# Patient Record
Sex: Male | Born: 1960 | Race: White | Hispanic: No | Smoking: Never smoker
Health system: Southern US, Community
[De-identification: ages and names within clinical notes are randomized; demographics above are authoritative.]

## PROBLEM LIST (undated history)

## (undated) DIAGNOSIS — E079 Disorder of thyroid, unspecified: Secondary | ICD-10-CM

## (undated) DIAGNOSIS — T7840XA Allergy, unspecified, initial encounter: Secondary | ICD-10-CM

## (undated) DIAGNOSIS — H9190 Unspecified hearing loss, unspecified ear: Secondary | ICD-10-CM

## (undated) DIAGNOSIS — G709 Myoneural disorder, unspecified: Secondary | ICD-10-CM

## (undated) DIAGNOSIS — J45909 Unspecified asthma, uncomplicated: Secondary | ICD-10-CM

## (undated) DIAGNOSIS — F419 Anxiety disorder, unspecified: Secondary | ICD-10-CM

## (undated) DIAGNOSIS — Z8619 Personal history of other infectious and parasitic diseases: Secondary | ICD-10-CM

## (undated) DIAGNOSIS — F329 Major depressive disorder, single episode, unspecified: Secondary | ICD-10-CM

## (undated) DIAGNOSIS — F32A Depression, unspecified: Secondary | ICD-10-CM

## (undated) DIAGNOSIS — K219 Gastro-esophageal reflux disease without esophagitis: Secondary | ICD-10-CM

## (undated) HISTORY — PX: SHOULDER SURGERY: SHX246

## (undated) HISTORY — PX: FRACTURE SURGERY: SHX138

## (undated) HISTORY — DX: Depression, unspecified: F32.A

## (undated) HISTORY — DX: Personal history of other infectious and parasitic diseases: Z86.19

## (undated) HISTORY — DX: Disorder of thyroid, unspecified: E07.9

## (undated) HISTORY — DX: Major depressive disorder, single episode, unspecified: F32.9

## (undated) HISTORY — PX: KNEE SURGERY: SHX244

## (undated) HISTORY — PX: TONSILLECTOMY: SUR1361

## (undated) HISTORY — DX: Allergy, unspecified, initial encounter: T78.40XA

## (undated) HISTORY — PX: SINUS SURGERY WITH INSTATRAK: SHX5215

## (undated) HISTORY — DX: Gastro-esophageal reflux disease without esophagitis: K21.9

---

## 2001-09-23 ENCOUNTER — Ambulatory Visit (HOSPITAL_BASED_OUTPATIENT_CLINIC_OR_DEPARTMENT_OTHER): Admission: RE | Admit: 2001-09-23 | Discharge: 2001-09-23 | Payer: Self-pay | Admitting: Orthopaedic Surgery

## 2003-01-31 ENCOUNTER — Inpatient Hospital Stay (HOSPITAL_COMMUNITY): Admission: EM | Admit: 2003-01-31 | Discharge: 2003-02-04 | Payer: Self-pay

## 2003-03-25 ENCOUNTER — Ambulatory Visit (HOSPITAL_COMMUNITY): Admission: RE | Admit: 2003-03-25 | Discharge: 2003-03-25 | Payer: Self-pay | Admitting: Family Medicine

## 2008-01-30 ENCOUNTER — Encounter: Admission: RE | Admit: 2008-01-30 | Discharge: 2008-01-30 | Payer: Self-pay | Admitting: Internal Medicine

## 2009-04-24 ENCOUNTER — Emergency Department (HOSPITAL_COMMUNITY): Admission: EM | Admit: 2009-04-24 | Discharge: 2009-04-24 | Payer: Self-pay | Admitting: Emergency Medicine

## 2009-11-10 ENCOUNTER — Encounter: Admission: RE | Admit: 2009-11-10 | Discharge: 2009-11-10 | Payer: Self-pay | Admitting: Otolaryngology

## 2010-03-21 LAB — POCT I-STAT, CHEM 8
BUN: 16 mg/dL (ref 6–23)
Chloride: 106 mEq/L (ref 96–112)
Creatinine, Ser: 0.8 mg/dL (ref 0.4–1.5)
Glucose, Bld: 86 mg/dL (ref 70–99)
Potassium: 4.1 mEq/L (ref 3.5–5.1)

## 2010-03-21 LAB — DIFFERENTIAL
Eosinophils Absolute: 0.1 10*3/uL (ref 0.0–0.7)
Lymphocytes Relative: 24 % (ref 12–46)
Lymphs Abs: 1.4 10*3/uL (ref 0.7–4.0)
Monocytes Relative: 11 % (ref 3–12)
Neutro Abs: 3.7 10*3/uL (ref 1.7–7.7)
Neutrophils Relative %: 64 % (ref 43–77)

## 2010-03-21 LAB — POCT CARDIAC MARKERS
CKMB, poc: 1.9 ng/mL (ref 1.0–8.0)
Troponin i, poc: <0.05 ng/mL (ref 0.00–0.09)

## 2010-03-21 LAB — CBC
MCV: 90.1 fL (ref 78.0–100.0)
RBC: 4.32 MIL/uL (ref 4.22–5.81)
WBC: 5.8 10*3/uL (ref 4.0–10.5)

## 2010-05-19 NOTE — Discharge Summary (Signed)
NAMESEANMICHAEL, SALMONS                            ACCOUNT NO.:  [id number]   MEDICAL RECORD NO.:  [id number]                   PATIENT TYPE:  INP   LOCATION:  3013                                 FACILITY:  [facility]   PHYSICIAN:  Jeremy Quarry, MD                   DATE OF BIRTH:  Jan 28, 1960   DATE OF ADMISSION:  01/31/2003  DATE OF DISCHARGE:  02/04/2003                                 DISCHARGE SUMMARY   HISTORY:  Jeremy [name] is a healthy 50 year old man who was at [facility] sledding on January 31, 2003, when he fell off the sled sustaining  an injury to the sacral area and subsequently developed intense pain in the  back.  There was no associated numbness, bladder problem or weakness in the  lower extremity.  The patient was initially transported to [facility] where an x-ray showed a 50% L2 compression fracture.  This was also  confirmed by a CT scan.  At the time of the patient's presentation I  reviewed his CT scan with the [facility] radiologist who felt that  this was a standard compression fracture with no evidence of deformity of  posterior elements.  The patient was admitted chiefly for pain management.   PHYSICAL EXAMINATION ON ADMISSION:  GENERAL:  The patient is an alert male  who is in moderate pain.  HEENT:  Exam is within normal limits.  CHEST:  Clear to auscultation and percussion.  BACK:  Examination of the back revealed tenderness, diffusely in the lumbar  area.  CARDIOVASCULAR:  Normal S1 and S2.  No murmur, gallop or rub.  ABDOMEN:  Benign, normal bowel sounds, no masses, tenderness or  organomegaly.  NEUROLOGIC:  Knee jerks and ankle jerks were 2+.  The patient was able to  dorsiflex and plantar flex the feet normally.  He could dorsiflex the great  toe, bilaterally without difficulty.  He was able to extend the legs and  flex and extend at the knees with excellent strength.  There was no sensory  impairment. During the course of the  patient's subsequent management he was  seen by Dr. Jerl [name] for evaluation.  Dr. Jerl [name] recommended that [employer]  supply the patient with a brace and this was accomplished.   LABORATORY DATA:  Lab studies obtained included a CBC which revealed a white  count of 6,300, hemoglobin 14.2, hematocrit 42, sodium 141, potassium 3.6,  glucose 97, creatinine 0.9.  Liver profile was normal.  A MRI scan of the  lumbar spine was done.  This again showed an L2 compression fracture with  anterior wedging.  No evidence of nerve root impingement was identified.   By February 04, 2003, the patient was ready for discharge.  During the later  portion of his hospitalization his main problem was constipation which  ultimately resolved with a combination of Lactulose and Dulcolax  suppository.  DISCHARGE MEDICATIONS:  1. Motrin 800 mg t.i.d. with meals.  2. Protonix 40 mg daily.  3. Tylox one to two q.4h. p.r.n. pain.  4. Lactulose one to two tablespoons p.r.n. constipation.   DISCHARGE DIAGNOSES:  1. L2 compression fracture.  2. Constipation.   DISCHARGE INSTRUCTIONS:  The patient was instructed at the time of discharge  to followup with Dr. Jerl [name] in one to two weeks.   CONDITION ON DISCHARGE:  Good.                                                Jeremy Quarry, MD    SY/MEDQ  D:  03/16/2003  T:  03/18/2003  Job:  [id number]   cc:   Jeremy [name], M.D.  [address]  [place]  [place] [address]  Fax: [phone]   Jeremy [name]. Jeremy [name], M.D.  [address]  [place]  [place] [address]  Fax: [phone]

## 2010-05-19 NOTE — Op Note (Signed)
[name], [name]                            ACCOUNT NO.:  [id number]   MEDICAL RECORD NO.:  [id number]                   PATIENT TYPE:  AMB   LOCATION:  DSC                                  FACILITY:  [facility]   PHYSICIAN:  Lubertha Basque. Jerl Santos, M.D.             DATE OF BIRTH:  [birth year]   DATE OF PROCEDURE:  09/23/2001  DATE OF DISCHARGE:                                 OPERATIVE REPORT   PREOPERATIVE DIAGNOSES:  1. Right knee torn medial meniscus.  2. Right knee chondromalacia of the patella.  3. Left knee synovitis.   POSTOPERATIVE DIAGNOSES:  1. Right knee torn medial meniscus.  2. Right knee chondromalacia of the patella.  3. Left knee synovitis.   PROCEDURES:  1. Right knee partial medial meniscectomy.  2. Right knee chondroplasty of the patella.  3. Right knee arthroscopic lateral release.  4. Left knee injection.   ANESTHESIA:  Knee block, MAC.   SURGEON:  Lubertha Basque. Jerl Santos, M.D.   ASSISTANT:  Prince Rome, P.A.   INDICATION FOR PROCEDURE:  The patient is a 50 year old male with a long  history of right and recent history of left knee pain.  His knee pain has  persisted despite activity restriction, supervised physical therapy, oral  anti-inflammatories, and bracing.  At this point he is offered operative  intervention.  An MRI scan has shown a medial meniscus tear and  chondromalacia of the patella.  The planned procedure is for an arthroscopy  on the right with an injection on the left.  Informed operative consent was  obtained after discussion of the possible complications of, reaction to  anesthesia, and infection.   DESCRIPTION OF PROCEDURE:  The patient was taken to the operating suite,  where knee block was applied along with MAC.  He was positioned supine and  prepped and draped in the normal sterile fashion.  After the administration  of preop IV antibiotics, an arthroscopy of the right knee was performed  through a total of three portals.  The  medial compartment exhibited a  degenerative tear of the posterior horn of the medial meniscus, addressed  with a partial medial meniscectomy taking about 10% of the structure back to  a stable rim.  There were no significant degenerative changes in this  compartment.  The lateral compartment exhibited no evidence of meniscal or  articular cartilage injury, and the ACL was intact.  The patellofemoral  joint exhibited grade 3 change across a good portion of the structure with  lateral tracking of the patella.  A thorough chondroplasty was done along  with an arthroscopic lateral release through the additional portal.  This  did improve the tracking of the patella significantly.  The knee was  thoroughly irrigated at the end of the case, followed by placement of  Marcaine with epinephrine and morphine.  Adaptic was applied to the portals,  followed by dry gauze  and a loose Ace wrap.  The left knee was the prepped  and injected with Depo-Medrol and lidocaine, where a Band-Aid was applied.  Estimated blood loss and intraoperative fluids can be obtained from  anesthesia records.   DISPOSITION:  The patient was taken to the recovery room in stable  condition.  Plans were for him to go home the same day and to follow up in  the office in less than a week.  I will contact him by phone tonight.                                               Lubertha Basque Jerl Santos, M.D.    PGD/MEDQ  D:  09/23/2001  T:  09/24/2001  Job:  [id number]

## 2014-09-11 DIAGNOSIS — J4520 Mild intermittent asthma, uncomplicated: Secondary | ICD-10-CM | POA: Insufficient documentation

## 2014-09-11 DIAGNOSIS — J3089 Other allergic rhinitis: Secondary | ICD-10-CM

## 2014-09-11 DIAGNOSIS — J302 Other seasonal allergic rhinitis: Secondary | ICD-10-CM | POA: Insufficient documentation

## 2014-09-11 DIAGNOSIS — J45990 Exercise induced bronchospasm: Secondary | ICD-10-CM

## 2014-09-28 ENCOUNTER — Other Ambulatory Visit: Payer: Self-pay

## 2014-09-28 MED ORDER — LEVOCETIRIZINE DIHYDROCHLORIDE 5 MG PO TABS: 5.0000 mg | ORAL_TABLET | Freq: Every day | ORAL | Status: DC

## 2014-09-28 NOTE — Telephone Encounter (Signed)
PATIENT CALLED AND WAS INQUIRING ABOUT HIS MEDICATION LEVOCETIRIZINE  TAB 90 DAY SUPPLY PATIENT WAS SEEN IN OFFICE ON 09/22/2014. PATIENT WAS WONDERING IF THE MEDICATION WAS SENT TO [facility]. PATIENT WOULD LIKE A CALL BACK.  THANKS

## 2014-09-28 NOTE — Telephone Encounter (Signed)
LEVOCETERIZINE  SENT TO [facility]. UNABLE TO  LEAVE MESSAGE FOR PATIENT.

## 2014-10-01 ENCOUNTER — Ambulatory Visit (INDEPENDENT_AMBULATORY_CARE_PROVIDER_SITE_OTHER): Payer: Self-pay | Admitting: *Deleted

## 2014-10-01 DIAGNOSIS — J309 Allergic rhinitis, unspecified: Secondary | ICD-10-CM

## 2014-10-04 ENCOUNTER — Ambulatory Visit (INDEPENDENT_AMBULATORY_CARE_PROVIDER_SITE_OTHER): Payer: Managed Care, Other (non HMO)

## 2014-10-04 DIAGNOSIS — J301 Allergic rhinitis due to pollen: Secondary | ICD-10-CM | POA: Diagnosis not present

## 2014-10-12 ENCOUNTER — Ambulatory Visit (INDEPENDENT_AMBULATORY_CARE_PROVIDER_SITE_OTHER): Payer: Managed Care, Other (non HMO) | Admitting: *Deleted

## 2014-10-12 ENCOUNTER — Telehealth: Payer: Self-pay | Admitting: *Deleted

## 2014-10-12 DIAGNOSIS — J309 Allergic rhinitis, unspecified: Secondary | ICD-10-CM

## 2014-10-12 NOTE — Telephone Encounter (Signed)
error 

## 2014-10-18 ENCOUNTER — Ambulatory Visit (INDEPENDENT_AMBULATORY_CARE_PROVIDER_SITE_OTHER): Payer: Managed Care, Other (non HMO) | Admitting: Neurology

## 2014-10-18 DIAGNOSIS — J309 Allergic rhinitis, unspecified: Secondary | ICD-10-CM

## 2014-10-26 ENCOUNTER — Ambulatory Visit (INDEPENDENT_AMBULATORY_CARE_PROVIDER_SITE_OTHER): Payer: Managed Care, Other (non HMO)

## 2014-10-26 DIAGNOSIS — J309 Allergic rhinitis, unspecified: Secondary | ICD-10-CM

## 2014-11-01 ENCOUNTER — Ambulatory Visit (INDEPENDENT_AMBULATORY_CARE_PROVIDER_SITE_OTHER): Payer: Managed Care, Other (non HMO) | Admitting: Neurology

## 2014-11-01 DIAGNOSIS — J309 Allergic rhinitis, unspecified: Secondary | ICD-10-CM | POA: Diagnosis not present

## 2014-11-10 ENCOUNTER — Ambulatory Visit (INDEPENDENT_AMBULATORY_CARE_PROVIDER_SITE_OTHER): Payer: Managed Care, Other (non HMO) | Admitting: Neurology

## 2014-11-10 DIAGNOSIS — J309 Allergic rhinitis, unspecified: Secondary | ICD-10-CM

## 2014-11-11 DIAGNOSIS — J301 Allergic rhinitis due to pollen: Secondary | ICD-10-CM | POA: Diagnosis not present

## 2014-11-12 DIAGNOSIS — J3089 Other allergic rhinitis: Secondary | ICD-10-CM | POA: Diagnosis not present

## 2014-11-17 ENCOUNTER — Ambulatory Visit (INDEPENDENT_AMBULATORY_CARE_PROVIDER_SITE_OTHER): Payer: Managed Care, Other (non HMO)

## 2014-11-17 DIAGNOSIS — J309 Allergic rhinitis, unspecified: Secondary | ICD-10-CM

## 2014-11-24 ENCOUNTER — Ambulatory Visit (INDEPENDENT_AMBULATORY_CARE_PROVIDER_SITE_OTHER): Payer: Managed Care, Other (non HMO) | Admitting: Neurology

## 2014-11-24 DIAGNOSIS — J309 Allergic rhinitis, unspecified: Secondary | ICD-10-CM

## 2014-11-29 ENCOUNTER — Other Ambulatory Visit: Payer: Self-pay | Admitting: Neurology

## 2014-11-29 MED ORDER — AZELASTINE-FLUTICASONE 137-50 MCG/ACT NA SUSP: NASAL | Status: DC

## 2014-11-30 ENCOUNTER — Ambulatory Visit (INDEPENDENT_AMBULATORY_CARE_PROVIDER_SITE_OTHER): Payer: Managed Care, Other (non HMO)

## 2014-11-30 DIAGNOSIS — J309 Allergic rhinitis, unspecified: Secondary | ICD-10-CM

## 2014-12-02 ENCOUNTER — Encounter: Payer: Self-pay | Admitting: Family Medicine

## 2014-12-02 ENCOUNTER — Telehealth: Payer: Self-pay | Admitting: Family Medicine

## 2014-12-02 ENCOUNTER — Ambulatory Visit (INDEPENDENT_AMBULATORY_CARE_PROVIDER_SITE_OTHER): Payer: Managed Care, Other (non HMO) | Admitting: Family Medicine

## 2014-12-02 ENCOUNTER — Encounter (INDEPENDENT_AMBULATORY_CARE_PROVIDER_SITE_OTHER): Payer: Self-pay

## 2014-12-02 VITALS — BP 106/69 | HR 54 | Ht 71.0 in | Wt 188.5 lb

## 2014-12-02 DIAGNOSIS — M7712 Lateral epicondylitis, left elbow: Secondary | ICD-10-CM | POA: Diagnosis not present

## 2014-12-02 DIAGNOSIS — M869 Osteomyelitis, unspecified: Secondary | ICD-10-CM | POA: Diagnosis not present

## 2014-12-02 DIAGNOSIS — M898X8 Other specified disorders of bone, other site: Secondary | ICD-10-CM

## 2014-12-02 MED ORDER — NITROGLYCERIN 0.2 MG/HR TD PT24: MEDICATED_PATCH | TRANSDERMAL | Status: DC

## 2014-12-02 NOTE — Telephone Encounter (Signed)
Sent in - please notify patient.  Thanks!

## 2014-12-02 NOTE — Patient Instructions (Addendum)
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 Ok to cross train with cycling, swimming if this is not painful. It's probably best to avoid running though for at least 6 weeks. Ice the area 3-4 times a day for 15 minutes at a time Aleve 2 tabs twice a day with food and tylenol to help with pain. Physical therapy for hip, pelvic, core stretching/strengthening has shown to be helpful for this condition. Rehab will focus on hip adduction and abduction exercises, abdominal/low back strengthening. Steroid injection and surgery are options if you do not improve with conservative therapy though generally these are not recommended unless you don't improve over several months.  You have lateral epicondylitis Try to avoid painful activities as much as possible. Ice the area 3-4 times a day for 15 minutes at a time. Tylenol or aleve as needed for pain. Counterforce brace as directed can help unload area - wear this regularly if it provides you with relief. Hammer rotation exercise, wrist extension exercise with 1 pound weight - 3 sets of 10 once a day.   Stretching - hold for 20-30 seconds and repeat 3 times. Let me know if you want to try the nitro patches - as we discussed you can't take viagra/cialis products while on this. Consider physical therapy, injection as well if not improving. Follow up in 6 weeks.

## 2014-12-06 ENCOUNTER — Telehealth: Payer: Self-pay | Admitting: Family Medicine

## 2014-12-06 ENCOUNTER — Encounter: Payer: Self-pay | Admitting: Family Medicine

## 2014-12-06 DIAGNOSIS — M7712 Lateral epicondylitis, left elbow: Secondary | ICD-10-CM | POA: Insufficient documentation

## 2014-12-06 DIAGNOSIS — M869 Osteomyelitis, unspecified: Secondary | ICD-10-CM | POA: Insufficient documentation

## 2014-12-06 NOTE — Assessment & Plan Note (Signed)
no evidence hernia.  Consistent with osteitis pubis.  Discussed importance of avoiding painful activities with this along with physical therapy.  Icing, nsaids.  F/u in 6 weeks.

## 2014-12-06 NOTE — Telephone Encounter (Signed)
Leave it on for 24 hours then remove it and place a new patch on in a slightly different spot.  Lynette please pass on the message.  Thanks!

## 2014-12-06 NOTE — Progress Notes (Addendum)
PCP: No primary care provider on file.  Subjective:   HPI: Patient is a 54 y.o. male here for groin, left elbow pain.  Patient reports he runs about 4-6 miles daily. Ran a half marathon on 10/16 and set a personal record ([place] here). At end of race he felt a pulling sensation lower left side of abdomen/groin area. Has persisted at 3/10 level, dull, feels with running. Can become sharp. Cannot sprint due to pain. Saw urologist and told he does not have a hernia. Is taking meloxicam without much improvement. Has had one visit with Melinda Crutchave Sutton at [facility]. Also with persistent left lateral elbow pain for several months. Worse first thing in morning, gripping items. Pain 1/10 here. Right handed. No skin changes, fever, bowel/bladder dysfunction.  No past medical history on file.  Current Outpatient Prescriptions on File Prior to Visit  Medication Sig Dispense Refill  . Albuterol Sulfate (PROAIR RESPICLICK) 108 (90 BASE) MCG/ACT AEPB Inhale 2 puffs into the lungs as needed.    . Azelastine-Fluticasone (DYMISTA) 137-50 MCG/ACT SUSP Use one spray in each nostril twice daily for nasal congestion or drainage. 1 Bottle 3  . levocetirizine (XYZAL) 5 MG tablet Take 1 tablet (5 mg total) by mouth daily. 90 tablet 2  . traZODone (DESYREL) 50 MG tablet Take 50 mg by mouth at bedtime.     No current facility-administered medications on file prior to visit.    Past Surgical History  Procedure Laterality Date  . Knee surgery    . Shoulder surgery    . Tonsillectomy    . Sinus surgery with instatrak      Allergies  Allergen Reactions  . Adhesive [Tape]     Social History   Social History  . Marital Status: Single    Spouse Name: N/A  . Number of Children: N/A  . Years of Education: N/A   Occupational History  . Not on file.   Social History Main Topics  . Smoking status: Never Smoker   . Smokeless tobacco: Not on file  . Alcohol Use: Not on  file  . Drug Use: Not on file  . Sexual Activity: Not on file   Other Topics Concern  . Not on file   Social History Narrative    No family history on file.  BP 106/69 mmHg  Pulse 54  Ht 5\' 11"  (1.803 m)  Wt 188 lb 8 oz (85.503 kg)  BMI 26.30 kg/m2  Review of Systems: See HPI above.    Objective:  Physical Exam:  Gen: NAD  Back/Left hip: No gross deformity, scoliosis. TTP pubic symphysis left side.  No other tenderness. FROM. Strength LEs 5/5 all muscle groups.  Pain with adduction left hip only.  Negative SLRs. Sensation intact to light touch bilaterally. Negative logroll Negative fabers and piriformis stretches.    GU: No hernia palpable or visible with valsalva.  Left elbow: No gross deformity, swelling, bruising. TTP lateral epicondyle. FROM with mild pain on wrist extension only. Collateral ligaments intact. NVI distally.  MSK u/s:  No evidence adductor tear.  Small amount of calcification to left side of pubic symphysis.  No other abnormalities.  Assessment & Plan:  1. Left hip pain - no evidence hernia.  Consistent with osteitis pubis.  Discussed importance of avoiding painful activities with this along with physical therapy.  Icing, nsaids.  F/u in 6 weeks.    2. Left lateral epicondylitis - shown home exercises to do daily.  Nsaids, icing.  Elbow sleeve provided.  Consider physical therapy, injection.  He will trial nitro patches but we stressed it's critical he not take products for erectile dysfunction while he is on this due to dramatic drop in blood pressure that would occur - he verbalized understanding.  F/u in 6 weeks.

## 2014-12-06 NOTE — Assessment & Plan Note (Signed)
shown home exercises to do daily.  Nsaids, icing.  Elbow sleeve provided.  Consider physical therapy, injection.  He will trial nitro patches but we stressed it's critical he not take products for erectile dysfunction while he is on this due to dramatic drop in blood pressure that would occur - he verbalized understanding.  F/u in 6 weeks.

## 2014-12-07 ENCOUNTER — Ambulatory Visit (INDEPENDENT_AMBULATORY_CARE_PROVIDER_SITE_OTHER): Payer: Managed Care, Other (non HMO)

## 2014-12-07 DIAGNOSIS — J309 Allergic rhinitis, unspecified: Secondary | ICD-10-CM | POA: Diagnosis not present

## 2014-12-09 ENCOUNTER — Ambulatory Visit (INDEPENDENT_AMBULATORY_CARE_PROVIDER_SITE_OTHER): Payer: Managed Care, Other (non HMO)

## 2014-12-09 DIAGNOSIS — J309 Allergic rhinitis, unspecified: Secondary | ICD-10-CM

## 2014-12-13 ENCOUNTER — Ambulatory Visit (INDEPENDENT_AMBULATORY_CARE_PROVIDER_SITE_OTHER): Payer: Managed Care, Other (non HMO) | Admitting: Neurology

## 2014-12-13 DIAGNOSIS — J309 Allergic rhinitis, unspecified: Secondary | ICD-10-CM | POA: Diagnosis not present

## 2014-12-17 ENCOUNTER — Ambulatory Visit (INDEPENDENT_AMBULATORY_CARE_PROVIDER_SITE_OTHER): Payer: Managed Care, Other (non HMO) | Admitting: *Deleted

## 2014-12-17 DIAGNOSIS — J309 Allergic rhinitis, unspecified: Secondary | ICD-10-CM

## 2014-12-20 ENCOUNTER — Ambulatory Visit (INDEPENDENT_AMBULATORY_CARE_PROVIDER_SITE_OTHER): Payer: Managed Care, Other (non HMO)

## 2014-12-20 DIAGNOSIS — J309 Allergic rhinitis, unspecified: Secondary | ICD-10-CM | POA: Diagnosis not present

## 2014-12-23 NOTE — Progress Notes (Signed)
This encounter was created in error - please disregard.

## 2014-12-28 ENCOUNTER — Ambulatory Visit (INDEPENDENT_AMBULATORY_CARE_PROVIDER_SITE_OTHER): Payer: Managed Care, Other (non HMO)

## 2014-12-28 DIAGNOSIS — J309 Allergic rhinitis, unspecified: Secondary | ICD-10-CM

## 2014-12-31 ENCOUNTER — Telehealth: Payer: Self-pay | Admitting: Allergy and Immunology

## 2014-12-31 NOTE — Telephone Encounter (Signed)
Spoke with patient who said that his PCP just had called him in something to his pharmacy.

## 2014-12-31 NOTE — Telephone Encounter (Signed)
Pt called and said that his daughter was diagnosis with the flu. Now he thanks he is coming down with it and would like for you to call in Tamflu for [phone] [facility] [address]

## 2015-01-06 DIAGNOSIS — J301 Allergic rhinitis due to pollen: Secondary | ICD-10-CM | POA: Diagnosis not present

## 2015-01-07 DIAGNOSIS — J3089 Other allergic rhinitis: Secondary | ICD-10-CM | POA: Diagnosis not present

## 2015-01-11 ENCOUNTER — Ambulatory Visit (INDEPENDENT_AMBULATORY_CARE_PROVIDER_SITE_OTHER): Payer: Managed Care, Other (non HMO)

## 2015-01-11 DIAGNOSIS — J309 Allergic rhinitis, unspecified: Secondary | ICD-10-CM

## 2015-01-12 ENCOUNTER — Ambulatory Visit (INDEPENDENT_AMBULATORY_CARE_PROVIDER_SITE_OTHER): Payer: Managed Care, Other (non HMO) | Admitting: Family Medicine

## 2015-01-12 ENCOUNTER — Encounter: Payer: Self-pay | Admitting: Family Medicine

## 2015-01-12 ENCOUNTER — Encounter (INDEPENDENT_AMBULATORY_CARE_PROVIDER_SITE_OTHER): Payer: Self-pay

## 2015-01-12 VITALS — BP 127/80 | HR 44 | Ht 71.0 in | Wt 179.0 lb

## 2015-01-12 DIAGNOSIS — M7712 Lateral epicondylitis, left elbow: Secondary | ICD-10-CM | POA: Diagnosis not present

## 2015-01-12 DIAGNOSIS — M869 Osteomyelitis, unspecified: Secondary | ICD-10-CM

## 2015-01-12 NOTE — Patient Instructions (Signed)
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 Walk: jog program every other day if not painful. Start with 1 minute jog: 1 minute walk for 10 minutes. Next jog would be 2 minute jog: 1 minute walk for 15 minutes. Third - 3 minute jog: 1 minute walk for 20 minutes etc. Ice the area after exercise. Aleve, tylenol only if needed. Home strengthening exercises as we discussed - core, adduction strengthening. Follow up with me in 6 weeks.

## 2015-01-13 ENCOUNTER — Ambulatory Visit: Payer: Managed Care, Other (non HMO) | Admitting: Family Medicine

## 2015-01-13 NOTE — Progress Notes (Signed)
PCP: No primary care provider on file.  Subjective:   HPI: Patient is a 55 y.o. male here for groin, left elbow pain.  12/02/14: Patient reports he runs about 4-6 miles daily. Ran a half marathon on 10/16 and set a personal record ([place] here). At end of race he felt a pulling sensation lower left side of abdomen/groin area. Has persisted at 3/10 level, dull, feels with running. Can become sharp. Cannot sprint due to pain. Saw urologist and told he does not have a hernia. Is taking meloxicam without much improvement. Has had one visit with Melinda Crutchave Sutton at [facility]. Also with persistent left lateral elbow pain for several months. Worse first thing in morning, gripping items. Pain 1/10 here. Right handed. No skin changes, fever, bowel/bladder dysfunction.  01/12/15: Patient reports he feels completely better though has not been running. Pain level 0/10. Still 'feels it' in the area if he starts to jog. Doing home exercises - did a couple visits of PT. Taking meloxicam as needed. No swelling, bruising. No skin changes, fever, other complaints.  No past medical history on file.  Current Outpatient Prescriptions on File Prior to Visit  Medication Sig Dispense Refill  . Albuterol Sulfate (PROAIR RESPICLICK) 108 (90 BASE) MCG/ACT AEPB Inhale 2 puffs into the lungs as needed.    . Azelastine-Fluticasone (DYMISTA) 137-50 MCG/ACT SUSP Use one spray in each nostril twice daily for nasal congestion or drainage. 1 Bottle 3  . levocetirizine (XYZAL) 5 MG tablet Take 1 tablet (5 mg total) by mouth daily. 90 tablet 2  . meloxicam (MOBIC) 7.5 MG tablet TAKE 1 TO 2 TABLETS BY MOUTH ONCE DAILY AS NEEDED FOR PAIN  1  . nitroGLYCERIN (NITRODUR - DOSED IN MG/24 HR) 0.2 mg/hr patch Apply 1/4th patch to affected elbow, change daily 30 patch 1  . traZODone (DESYREL) 50 MG tablet Take 50 mg by mouth at bedtime.     No current facility-administered medications on file  prior to visit.    Past Surgical History  Procedure Laterality Date  . Knee surgery    . Shoulder surgery    . Tonsillectomy    . Sinus surgery with instatrak      Allergies  Allergen Reactions  . Adhesive [Tape]     Social History   Social History  . Marital Status: Single    Spouse Name: N/A  . Number of Children: N/A  . Years of Education: N/A   Occupational History  . Not on file.   Social History Main Topics  . Smoking status: Never Smoker   . Smokeless tobacco: Not on file  . Alcohol Use: Not on file  . Drug Use: Not on file  . Sexual Activity: Not on file   Other Topics Concern  . Not on file   Social History Narrative    No family history on file.  BP 127/80 mmHg  Pulse 44  Ht 5\' 11"  (1.803 m)  Wt 179 lb (81.194 kg)  BMI 24.98 kg/m2  Review of Systems: See HPI above.    Objective:  Physical Exam:  Gen: NAD, comfortable in exam room.  Back/Left hip: No gross deformity, scoliosis. No longer with TTP pubic symphysis left side.  No other tenderness. FROM. Strength LEs 5/5 all muscle groups.  No pain with adduction on left any longer  Negative SLRs. Sensation intact to light touch bilaterally. Negative logroll Negative fabers and piriformis stretches.  Assessment & Plan:  1. Left hip pain - no  evidence hernia.  Consistent with osteitis pubis.  Clinically improved with PT, home exercises, and rest.  Icing, nsaids as needed.  Continue home exercises and physical therapy.  He will attempt walk: jog program very slowly every other day - instructed to stop if pain worsens however and rest additional 6 weeks.  F/u at that time.  2. Left lateral epicondylitis - Improving.  NSAIDs, icing, nitro patches, elbow sleeve, home exercises.

## 2015-01-13 NOTE — Assessment & Plan Note (Signed)
Improving.  NSAIDs, icing, nitro patches, elbow sleeve, home exercises.

## 2015-01-13 NOTE — Assessment & Plan Note (Signed)
no evidence hernia.  Consistent with osteitis pubis.  Clinically improved with PT, home exercises, and rest.  Icing, nsaids as needed.  Continue home exercises and physical therapy.  He will attempt walk: jog program very slowly every other day - instructed to stop if pain worsens however and rest additional 6 weeks.  F/u at that time.

## 2015-01-18 ENCOUNTER — Ambulatory Visit (INDEPENDENT_AMBULATORY_CARE_PROVIDER_SITE_OTHER): Payer: Managed Care, Other (non HMO)

## 2015-01-18 DIAGNOSIS — J309 Allergic rhinitis, unspecified: Secondary | ICD-10-CM

## 2015-01-26 ENCOUNTER — Ambulatory Visit (INDEPENDENT_AMBULATORY_CARE_PROVIDER_SITE_OTHER): Payer: Managed Care, Other (non HMO)

## 2015-01-26 DIAGNOSIS — J309 Allergic rhinitis, unspecified: Secondary | ICD-10-CM | POA: Diagnosis not present

## 2015-02-01 ENCOUNTER — Ambulatory Visit (INDEPENDENT_AMBULATORY_CARE_PROVIDER_SITE_OTHER): Payer: Managed Care, Other (non HMO)

## 2015-02-01 DIAGNOSIS — J309 Allergic rhinitis, unspecified: Secondary | ICD-10-CM | POA: Diagnosis not present

## 2015-02-11 ENCOUNTER — Ambulatory Visit (INDEPENDENT_AMBULATORY_CARE_PROVIDER_SITE_OTHER): Payer: Managed Care, Other (non HMO) | Admitting: Neurology

## 2015-02-11 DIAGNOSIS — J309 Allergic rhinitis, unspecified: Secondary | ICD-10-CM

## 2015-02-23 ENCOUNTER — Ambulatory Visit (INDEPENDENT_AMBULATORY_CARE_PROVIDER_SITE_OTHER): Payer: Managed Care, Other (non HMO)

## 2015-02-23 DIAGNOSIS — J309 Allergic rhinitis, unspecified: Secondary | ICD-10-CM

## 2015-03-01 ENCOUNTER — Ambulatory Visit (INDEPENDENT_AMBULATORY_CARE_PROVIDER_SITE_OTHER): Payer: Managed Care, Other (non HMO)

## 2015-03-01 DIAGNOSIS — J309 Allergic rhinitis, unspecified: Secondary | ICD-10-CM

## 2015-03-08 ENCOUNTER — Ambulatory Visit (INDEPENDENT_AMBULATORY_CARE_PROVIDER_SITE_OTHER): Payer: Managed Care, Other (non HMO)

## 2015-03-08 DIAGNOSIS — J309 Allergic rhinitis, unspecified: Secondary | ICD-10-CM

## 2015-03-17 ENCOUNTER — Ambulatory Visit (INDEPENDENT_AMBULATORY_CARE_PROVIDER_SITE_OTHER): Payer: Managed Care, Other (non HMO)

## 2015-03-17 DIAGNOSIS — J309 Allergic rhinitis, unspecified: Secondary | ICD-10-CM

## 2015-03-19 ENCOUNTER — Other Ambulatory Visit: Payer: Self-pay | Admitting: Family Medicine

## 2015-04-05 ENCOUNTER — Ambulatory Visit (INDEPENDENT_AMBULATORY_CARE_PROVIDER_SITE_OTHER): Payer: Managed Care, Other (non HMO) | Admitting: *Deleted

## 2015-04-05 DIAGNOSIS — J309 Allergic rhinitis, unspecified: Secondary | ICD-10-CM

## 2015-04-07 ENCOUNTER — Telehealth: Payer: Self-pay | Admitting: Allergy and Immunology

## 2015-04-07 NOTE — Telephone Encounter (Signed)
He currently has [employer] insurance but is starting a new job in May. His [employer] will cover through the month of April and then the new insurance will pick up in May. He has 2 questions regarding the price of his vials and shots with [employer] vs. [employer]. He is trying to decide if he would be better off just paying out of his pocket for these and if it would be less expensive to pay out of pocket vs. Paying to keep the [employer] insurance and paying the monthly premium on that.

## 2015-04-08 NOTE — Telephone Encounter (Signed)
Gave pt prices & what [employer] allows so he can make his decision

## 2015-04-12 DIAGNOSIS — J301 Allergic rhinitis due to pollen: Secondary | ICD-10-CM | POA: Diagnosis not present

## 2015-04-13 DIAGNOSIS — J3089 Other allergic rhinitis: Secondary | ICD-10-CM | POA: Diagnosis not present

## 2015-04-26 ENCOUNTER — Ambulatory Visit (INDEPENDENT_AMBULATORY_CARE_PROVIDER_SITE_OTHER): Payer: Managed Care, Other (non HMO) | Admitting: *Deleted

## 2015-04-26 DIAGNOSIS — J309 Allergic rhinitis, unspecified: Secondary | ICD-10-CM | POA: Diagnosis not present

## 2015-05-12 ENCOUNTER — Ambulatory Visit (INDEPENDENT_AMBULATORY_CARE_PROVIDER_SITE_OTHER): Payer: Managed Care, Other (non HMO)

## 2015-05-12 DIAGNOSIS — J309 Allergic rhinitis, unspecified: Secondary | ICD-10-CM | POA: Diagnosis not present

## 2015-05-26 ENCOUNTER — Ambulatory Visit (INDEPENDENT_AMBULATORY_CARE_PROVIDER_SITE_OTHER): Payer: Managed Care, Other (non HMO)

## 2015-05-26 DIAGNOSIS — J309 Allergic rhinitis, unspecified: Secondary | ICD-10-CM | POA: Diagnosis not present

## 2015-06-08 ENCOUNTER — Other Ambulatory Visit: Payer: Self-pay | Admitting: *Deleted

## 2015-06-08 NOTE — Telephone Encounter (Signed)
Received fax refill from [facility] for Levocetirizine 5mg  denied refill last office visit 06/30/14 needs ov. Faxed to [phone]

## 2015-06-09 ENCOUNTER — Other Ambulatory Visit: Payer: Self-pay

## 2015-06-09 ENCOUNTER — Telehealth: Payer: Self-pay | Admitting: Allergy and Immunology

## 2015-06-09 ENCOUNTER — Ambulatory Visit (INDEPENDENT_AMBULATORY_CARE_PROVIDER_SITE_OTHER): Payer: PRIVATE HEALTH INSURANCE

## 2015-06-09 DIAGNOSIS — J309 Allergic rhinitis, unspecified: Secondary | ICD-10-CM

## 2015-06-09 MED ORDER — LEVOCETIRIZINE DIHYDROCHLORIDE 5 MG PO TABS: 5.0000 mg | ORAL_TABLET | Freq: Every day | ORAL | Status: DC

## 2015-06-09 NOTE — Telephone Encounter (Signed)
Sent in one refill 

## 2015-06-09 NOTE — Telephone Encounter (Addendum)
Pt came in to get shot and wanted to know why we had not called his levocetirizine 5mg . He made appointment for  July 17 at 4.oopm.

## 2015-06-24 ENCOUNTER — Ambulatory Visit (INDEPENDENT_AMBULATORY_CARE_PROVIDER_SITE_OTHER): Payer: PRIVATE HEALTH INSURANCE

## 2015-06-24 DIAGNOSIS — J309 Allergic rhinitis, unspecified: Secondary | ICD-10-CM | POA: Diagnosis not present

## 2015-06-28 ENCOUNTER — Ambulatory Visit: Payer: PRIVATE HEALTH INSURANCE | Admitting: Allergy and Immunology

## 2015-07-07 ENCOUNTER — Ambulatory Visit (INDEPENDENT_AMBULATORY_CARE_PROVIDER_SITE_OTHER): Payer: PRIVATE HEALTH INSURANCE

## 2015-07-07 DIAGNOSIS — J309 Allergic rhinitis, unspecified: Secondary | ICD-10-CM | POA: Diagnosis not present

## 2015-07-18 ENCOUNTER — Encounter: Payer: Self-pay | Admitting: Allergy and Immunology

## 2015-07-18 ENCOUNTER — Ambulatory Visit (INDEPENDENT_AMBULATORY_CARE_PROVIDER_SITE_OTHER): Payer: PRIVATE HEALTH INSURANCE | Admitting: Allergy and Immunology

## 2015-07-18 VITALS — BP 130/80 | HR 66 | Temp 97.5°F | Resp 16

## 2015-07-18 DIAGNOSIS — J45990 Exercise induced bronchospasm: Secondary | ICD-10-CM

## 2015-07-18 DIAGNOSIS — J3089 Other allergic rhinitis: Secondary | ICD-10-CM | POA: Diagnosis not present

## 2015-07-18 MED ORDER — AZELASTINE-FLUTICASONE 137-50 MCG/ACT NA SUSP: NASAL | Status: DC

## 2015-07-18 MED ORDER — ALBUTEROL SULFATE 108 (90 BASE) MCG/ACT IN AEPB: 2.0000 | INHALATION_SPRAY | RESPIRATORY_TRACT | Status: DC | PRN

## 2015-07-18 NOTE — Assessment & Plan Note (Signed)
   Continue ProAir Respiclick every 4-6 hours as needed and 15 minutes prior to exercise.

## 2015-07-18 NOTE — Progress Notes (Signed)
Follow-up Note  RE: Jeremy [name] Schepers MRN: [id number] DOB: 11/02/1960 Date of Office Visit: 07/18/2015  Primary care provider: Allean FoundSMITH,CANDACE THIELE, MD Referring provider: No ref. provider found  History of present illness: HPI Comments: Jeremy [name] Kritikos is a 55 y.o. male with allergic rhinitis on immunotherapy and exercise-induced bronchospasm presenting today for follow up.  He reports that his allergic rhinitis is well-controlled with the aeroallergen immunotherapy and as needed levocetirizine and/or Dymista.  He is tolerating immunotherapy without complications.  He uses albuterol prior to vigorous exercise but has not required albuterol for rescue purposes over the past year.  He has no complaints today.   Assessment and plan: Other allergic rhinitis  Continue appropriate allergen avoidance measures, aeroallergen immunotherapy, levocetirizine as needed, and/or Dymista as needed.  Exercise-induced bronchospasm  Continue ProAir Respiclick every 4-6 hours as needed and 15 minutes prior to exercise.    Meds ordered this encounter  Medications  . Albuterol Sulfate (PROAIR RESPICLICK) 108 (90 Base) MCG/ACT AEPB    Sig: Inhale 2 puffs into the lungs as needed. For cough or wheeze.  May use 2 puffs 10-20 minutes prior to exercise.    Dispense:  3 each    Refill:  0  . Azelastine-Fluticasone (DYMISTA) 137-50 MCG/ACT SUSP    Sig: Use 1-2 sprays in each nostril 1-2 times daily for stuffy nose or drainage.    Dispense:  3 Bottle    Refill:  1     Physical examination: Blood pressure 130/80, pulse 66, temperature 97.5 F (36.4 C), temperature source Oral, resp. rate 16.  General: Alert, interactive, in no acute distress. HEENT: TMs pearly gray, turbinates mildly edematous without discharge, post-pharynx unremarkable. Neck: Supple without lymphadenopathy. Lungs: Clear to auscultation without wheezing, rhonchi or rales. CV: Normal S1, S2 without murmurs. Skin: Warm and dry, without  lesions or rashes.  The following portions of the patient's history were reviewed and updated as appropriate: allergies, current medications, past family history, past medical history, past social history, past surgical history and problem list.    Medication List       This list is accurate as of: 07/18/15  6:05 PM.  Always use your most recent med list.               Albuterol Sulfate 108 (90 Base) MCG/ACT Aepb  Commonly known as:  PROAIR RESPICLICK  Inhale 2 puffs into the lungs as needed. For cough or wheeze.  May use 2 puffs 10-20 minutes prior to exercise.     Azelastine-Fluticasone 137-50 MCG/ACT Susp  Commonly known as:  DYMISTA  Use 1-2 sprays in each nostril 1-2 times daily for stuffy nose or drainage.     EPIPEN 2-PAK 0.3 mg/0.3 mL Soaj injection  Generic drug:  EPINEPHrine  USE AS DIRECTED FOR SEVERE ALLERGIC REACTION     levocetirizine 5 MG tablet  Commonly known as:  XYZAL  Take 1 tablet (5 mg total) by mouth daily.     meloxicam 7.5 MG tablet  Commonly known as:  MOBIC  Reported on 07/18/2015     nitroGLYCERIN 0.2 mg/hr patch  Commonly known as:  NITRODUR - Dosed in mg/24 hr  APPLY 1/4TH PATCH TO AFFECTED ELBOW, CHANGE DAILY     traZODone 50 MG tablet  Commonly known as:  DESYREL  Take 50 mg by mouth at bedtime.        Allergies  Allergen Reactions  . Adhesive [Tape]     I appreciate the opportunity to take part in  Reford's care. Please do not hesitate to contact me with questions.  Sincerely,   R. Jorene Guest, MD

## 2015-07-18 NOTE — Patient Instructions (Signed)
Other allergic rhinitis  Continue appropriate allergen avoidance measures, aeroallergen immunotherapy, levocetirizine as needed, and/or Dymista as needed.  Exercise-induced bronchospasm  Continue ProAir Respiclick every 4-6 hours as needed and 15 minutes prior to exercise.   Return in about 1 year (around 07/17/2016), or if symptoms worsen or fail to improve.

## 2015-07-18 NOTE — Assessment & Plan Note (Signed)
   Continue appropriate allergen avoidance measures, aeroallergen immunotherapy, levocetirizine as needed, and/or Dymista as needed.

## 2015-08-02 ENCOUNTER — Other Ambulatory Visit (HOSPITAL_COMMUNITY): Admission: RE | Admit: 2015-08-02 | Payer: PRIVATE HEALTH INSURANCE | Source: Ambulatory Visit

## 2015-08-04 ENCOUNTER — Ambulatory Visit (INDEPENDENT_AMBULATORY_CARE_PROVIDER_SITE_OTHER): Payer: PRIVATE HEALTH INSURANCE

## 2015-08-04 DIAGNOSIS — J309 Allergic rhinitis, unspecified: Secondary | ICD-10-CM

## 2015-08-12 ENCOUNTER — Telehealth: Payer: Self-pay | Admitting: *Deleted

## 2015-08-12 ENCOUNTER — Ambulatory Visit (INDEPENDENT_AMBULATORY_CARE_PROVIDER_SITE_OTHER): Payer: PRIVATE HEALTH INSURANCE | Admitting: *Deleted

## 2015-08-12 DIAGNOSIS — J309 Allergic rhinitis, unspecified: Secondary | ICD-10-CM | POA: Diagnosis not present

## 2015-08-12 NOTE — Telephone Encounter (Addendum)
PATIENT WOULD LIKE TO KNOW IF HE CAN BUILD UP EVERY 2 WEEKS SINCE HE HAS TO PAY A COPAY EACH TIME. PATIENT IS ON RED 1:100 SCH C  EVERY 2 WEEKS. PLEASE ADVISE

## 2015-08-15 NOTE — Telephone Encounter (Signed)
Yes, that is fine. 

## 2015-08-15 NOTE — Telephone Encounter (Signed)
I have documented this change on the pts chart

## 2015-08-15 NOTE — Telephone Encounter (Signed)
Spoke with Jeremy [name] and informed him of what dr Rod Canbobbit has stated about going every 2 weeks on injections

## 2015-08-15 NOTE — Telephone Encounter (Signed)
Lm for pt to call us about the change to injections

## 2015-08-25 ENCOUNTER — Ambulatory Visit (INDEPENDENT_AMBULATORY_CARE_PROVIDER_SITE_OTHER): Payer: PRIVATE HEALTH INSURANCE

## 2015-08-25 DIAGNOSIS — J309 Allergic rhinitis, unspecified: Secondary | ICD-10-CM | POA: Diagnosis not present

## 2015-08-31 ENCOUNTER — Encounter: Payer: Self-pay | Admitting: *Deleted

## 2015-08-31 NOTE — Progress Notes (Signed)
This encounter was created in error - please disregard.

## 2015-09-08 ENCOUNTER — Ambulatory Visit (INDEPENDENT_AMBULATORY_CARE_PROVIDER_SITE_OTHER): Payer: PRIVATE HEALTH INSURANCE

## 2015-09-08 DIAGNOSIS — J309 Allergic rhinitis, unspecified: Secondary | ICD-10-CM

## 2015-09-20 ENCOUNTER — Ambulatory Visit (INDEPENDENT_AMBULATORY_CARE_PROVIDER_SITE_OTHER): Payer: PRIVATE HEALTH INSURANCE

## 2015-09-20 DIAGNOSIS — J309 Allergic rhinitis, unspecified: Secondary | ICD-10-CM

## 2015-10-05 ENCOUNTER — Ambulatory Visit (INDEPENDENT_AMBULATORY_CARE_PROVIDER_SITE_OTHER): Payer: PRIVATE HEALTH INSURANCE

## 2015-10-05 DIAGNOSIS — J309 Allergic rhinitis, unspecified: Secondary | ICD-10-CM

## 2015-10-17 ENCOUNTER — Ambulatory Visit (INDEPENDENT_AMBULATORY_CARE_PROVIDER_SITE_OTHER): Payer: PRIVATE HEALTH INSURANCE | Admitting: *Deleted

## 2015-10-17 DIAGNOSIS — J309 Allergic rhinitis, unspecified: Secondary | ICD-10-CM

## 2015-10-25 DIAGNOSIS — J3089 Other allergic rhinitis: Secondary | ICD-10-CM | POA: Diagnosis not present

## 2015-10-26 DIAGNOSIS — J301 Allergic rhinitis due to pollen: Secondary | ICD-10-CM | POA: Diagnosis not present

## 2015-11-01 ENCOUNTER — Ambulatory Visit (INDEPENDENT_AMBULATORY_CARE_PROVIDER_SITE_OTHER): Payer: PRIVATE HEALTH INSURANCE

## 2015-11-01 DIAGNOSIS — J309 Allergic rhinitis, unspecified: Secondary | ICD-10-CM

## 2015-11-16 ENCOUNTER — Ambulatory Visit (INDEPENDENT_AMBULATORY_CARE_PROVIDER_SITE_OTHER): Payer: PRIVATE HEALTH INSURANCE | Admitting: *Deleted

## 2015-11-16 DIAGNOSIS — J309 Allergic rhinitis, unspecified: Secondary | ICD-10-CM | POA: Diagnosis not present

## 2015-12-01 ENCOUNTER — Ambulatory Visit (INDEPENDENT_AMBULATORY_CARE_PROVIDER_SITE_OTHER): Payer: PRIVATE HEALTH INSURANCE | Admitting: *Deleted

## 2015-12-01 DIAGNOSIS — J309 Allergic rhinitis, unspecified: Secondary | ICD-10-CM

## 2015-12-13 ENCOUNTER — Ambulatory Visit (INDEPENDENT_AMBULATORY_CARE_PROVIDER_SITE_OTHER): Payer: PRIVATE HEALTH INSURANCE | Admitting: *Deleted

## 2015-12-13 DIAGNOSIS — J309 Allergic rhinitis, unspecified: Secondary | ICD-10-CM

## 2016-01-03 ENCOUNTER — Ambulatory Visit (INDEPENDENT_AMBULATORY_CARE_PROVIDER_SITE_OTHER): Payer: PRIVATE HEALTH INSURANCE | Admitting: *Deleted

## 2016-01-03 DIAGNOSIS — J309 Allergic rhinitis, unspecified: Secondary | ICD-10-CM

## 2016-01-13 ENCOUNTER — Ambulatory Visit (INDEPENDENT_AMBULATORY_CARE_PROVIDER_SITE_OTHER): Payer: PRIVATE HEALTH INSURANCE

## 2016-01-13 DIAGNOSIS — J309 Allergic rhinitis, unspecified: Secondary | ICD-10-CM | POA: Diagnosis not present

## 2016-01-24 ENCOUNTER — Ambulatory Visit (INDEPENDENT_AMBULATORY_CARE_PROVIDER_SITE_OTHER): Payer: PRIVATE HEALTH INSURANCE | Admitting: *Deleted

## 2016-01-24 DIAGNOSIS — J309 Allergic rhinitis, unspecified: Secondary | ICD-10-CM | POA: Diagnosis not present

## 2016-01-25 NOTE — Addendum Note (Signed)
Addended by: Maryjean MornFREEMAN, Eliu Batch D on: 01/25/2016 02:47 PM   Modules accepted: Orders

## 2016-02-07 ENCOUNTER — Ambulatory Visit (INDEPENDENT_AMBULATORY_CARE_PROVIDER_SITE_OTHER): Payer: PRIVATE HEALTH INSURANCE

## 2016-02-07 DIAGNOSIS — J309 Allergic rhinitis, unspecified: Secondary | ICD-10-CM | POA: Diagnosis not present

## 2016-03-07 ENCOUNTER — Ambulatory Visit (INDEPENDENT_AMBULATORY_CARE_PROVIDER_SITE_OTHER): Payer: PRIVATE HEALTH INSURANCE | Admitting: *Deleted

## 2016-03-07 DIAGNOSIS — J309 Allergic rhinitis, unspecified: Secondary | ICD-10-CM | POA: Diagnosis not present

## 2016-04-12 ENCOUNTER — Ambulatory Visit (INDEPENDENT_AMBULATORY_CARE_PROVIDER_SITE_OTHER): Payer: PRIVATE HEALTH INSURANCE | Admitting: *Deleted

## 2016-04-12 DIAGNOSIS — J309 Allergic rhinitis, unspecified: Secondary | ICD-10-CM

## 2016-05-07 ENCOUNTER — Ambulatory Visit (INDEPENDENT_AMBULATORY_CARE_PROVIDER_SITE_OTHER): Payer: PRIVATE HEALTH INSURANCE

## 2016-05-07 DIAGNOSIS — J309 Allergic rhinitis, unspecified: Secondary | ICD-10-CM

## 2016-05-09 ENCOUNTER — Encounter: Payer: Self-pay | Admitting: *Deleted

## 2016-05-10 ENCOUNTER — Telehealth: Payer: Self-pay

## 2016-05-10 MED ORDER — FLUTICASONE PROPIONATE 50 MCG/ACT NA SUSP: 2.0000 | Freq: Every day | NASAL | 0 refills | Status: DC

## 2016-05-10 MED ORDER — AZELASTINE HCL 0.15 % NA SOLN: 2.0000 | Freq: Two times a day (BID) | NASAL | 0 refills | Status: DC

## 2016-05-10 NOTE — Telephone Encounter (Signed)
Called patient. We received a fax from [facility] for Flonase. I wanted to confirm with patient if that was corrected,because he was originally on Dymista. Patietnt stated the Dymista is not covered. I am sending in Flonase and Azelastine for 90 days.  Patient has an appointment for 07/09/2016 with Dr. Nunzio CobbsBobbitt.

## 2016-05-11 DIAGNOSIS — J3089 Other allergic rhinitis: Secondary | ICD-10-CM | POA: Diagnosis not present

## 2016-05-16 ENCOUNTER — Encounter: Payer: Self-pay | Admitting: *Deleted

## 2016-05-21 ENCOUNTER — Encounter: Payer: Self-pay | Admitting: Allergy and Immunology

## 2016-05-21 ENCOUNTER — Ambulatory Visit (INDEPENDENT_AMBULATORY_CARE_PROVIDER_SITE_OTHER): Payer: PRIVATE HEALTH INSURANCE | Admitting: Allergy and Immunology

## 2016-05-21 VITALS — BP 100/62 | HR 69 | Temp 98.1°F | Resp 16 | Ht 70.0 in | Wt 182.4 lb

## 2016-05-21 DIAGNOSIS — J45901 Unspecified asthma with (acute) exacerbation: Secondary | ICD-10-CM

## 2016-05-21 DIAGNOSIS — J3089 Other allergic rhinitis: Secondary | ICD-10-CM

## 2016-05-21 DIAGNOSIS — J0100 Acute maxillary sinusitis, unspecified: Secondary | ICD-10-CM | POA: Diagnosis not present

## 2016-05-21 DIAGNOSIS — J45990 Exercise induced bronchospasm: Secondary | ICD-10-CM

## 2016-05-21 DIAGNOSIS — J019 Acute sinusitis, unspecified: Secondary | ICD-10-CM | POA: Insufficient documentation

## 2016-05-21 MED ORDER — ALBUTEROL SULFATE 108 (90 BASE) MCG/ACT IN AEPB: 2.0000 | INHALATION_SPRAY | RESPIRATORY_TRACT | 0 refills | Status: DC | PRN

## 2016-05-21 MED ORDER — BUDESONIDE 0.5 MG/2ML IN SUSP: 0.5000 mg | Freq: Two times a day (BID) | RESPIRATORY_TRACT | 1 refills | Status: DC | PRN

## 2016-05-21 MED ORDER — PREDNISONE 1 MG PO TABS: 10.0000 mg | ORAL_TABLET | Freq: Every day | ORAL | Status: DC

## 2016-05-21 NOTE — Assessment & Plan Note (Signed)
   Prednisone has been provided, 40 mg x3 days, 20 mg x3 day, 10 mg x3 day, then stop.  For now and during respiratory tract infections or asthma flares, add Asmanex 200g 2 inhalations 2 times per day until symptoms have returned to baseline.   To maximize pulmonary deposition, a spacer has been provided along with instructions for its proper administration with an HFA inhaler.  Continue albuterol HFA, 1-2 inhalations every 4-6 hours as needed.  The patient has been asked to contact me if his symptoms persist or progress. Otherwise, he may return for follow up in 4 months.

## 2016-05-21 NOTE — Assessment & Plan Note (Addendum)
   Prednisone has been provided (as above).  Start budesonide/saline nasal irrigation twice a day.  A prescription has been provided for budesonide 0.5 mg respules and instructions for mixing and adminstering the rinse have been discussed and provided in written form.  (Hold fluticasone nasal spray while using budesonide/saline nasal irrigation).  Continue azelastine nasal spray, 1-2 sprays per nostril twice daily as needed.  For thick post nasal drainage, add guaifenesin 1200 mg (Mucinex Maximum Strength)  twice daily as needed with adequate hydration as discussed.  (Hold levocetirizine while taking Mucinex, as levocetirizine may increase mucus viscosity).   The patient has been asked to contact me if his symptoms persist, progress, or if he becomes febrile.

## 2016-05-21 NOTE — Assessment & Plan Note (Signed)
   Continue appropriate allergen avoidance measures and resume aeroallergen immunotherapy injections after the asthma exacerbation has resolved.

## 2016-05-21 NOTE — Progress Notes (Signed)
Follow-up Note  RE: Jeremy [name] [place] MRN: [id number] DOB: 01/30/1960 Date of Office Visit: 05/21/2016  Primary care provider: Merri BrunetteSmith, Candace, MD Referring provider: Merri BrunetteSmith, Candace, MD  History of present illness: Jeremy [name] is a 56 y.o. male with allergic rhinitis in the next as needed bronchospasm presented today for follow up.  He was last seen in this clinic in July 2017.  He reports that over the past 3 weeks he has been experiencing coughing, dyspnea, chest tightness, and decreased exercise capacity. He typically does aerobic exercise for an hour per day, however recently he has had decreased exercise opacity.  He denies referred pain to the jaw and upper extremity.  Over the same timeframe he has been experiencing frequent sneezing, nasal congestion, thick postnasal drainage, sinus pressure, and ear pressure.  He believes he may have experienced low-grade fever on one or 2 occasions over the past couple weeks, but is uncertain certain.  He denies chills and discolored mucus production.   Assessment and plan: Asthma with acute exacerbation  Prednisone has been provided, 40 mg x3 days, 20 mg x3 day, 10 mg x3 day, then stop.  For now and during respiratory tract infections or asthma flares, add Asmanex 200g 2 inhalations 2 times per day until symptoms have returned to baseline.   To maximize pulmonary deposition, a spacer has been provided along with instructions for its proper administration with an HFA inhaler.  Continue albuterol HFA, 1-2 inhalations every 4-6 hours as needed.  The patient has been asked to contact me if his symptoms persist or progress. Otherwise, he may return for follow up in 4 months.  Acute sinusitis  Prednisone has been provided (as above).  Start budesonide/saline nasal irrigation twice a day.  A prescription has been provided for budesonide 0.5 mg respules and instructions for mixing and adminstering the rinse have been discussed and provided in written  form.  (Hold fluticasone nasal spray while using budesonide/saline nasal irrigation).  Continue azelastine nasal spray, 1-2 sprays per nostril twice daily as needed.  For thick post nasal drainage, add guaifenesin 1200 mg (Mucinex Maximum Strength)  twice daily as needed with adequate hydration as discussed.  (Hold levocetirizine while taking Mucinex, as levocetirizine may increase mucus viscosity).   The patient has been asked to contact me if his symptoms persist, progress, or if he becomes febrile.   Other allergic rhinitis  Continue appropriate allergen avoidance measures and resume aeroallergen immunotherapy injections after the asthma exacerbation has resolved.   Meds ordered this encounter  Medications  . Albuterol Sulfate (PROAIR RESPICLICK) 108 (90 Base) MCG/ACT AEPB    Sig: Inhale 2 puffs into the lungs as needed. For cough or wheeze.  May use 2 puffs 10-20 minutes prior to exercise.    Dispense:  3 each    Refill:  0  . budesonide (PULMICORT) 0.5 MG/2ML nebulizer solution    Sig: Take 2 mLs (0.5 mg total) by nebulization 2 (two) times daily as needed.    Dispense:  120 mL    Refill:  1  . predniSONE (DELTASONE) tablet 10 mg    Diagnostics: Spirometry reveals an FVC of 5.14 L and an FEV1 of 3.45 L, FEV1 ratio of 84% with 190 mL post bronchodilator improvement.   Please see scanned spirometry results for details.    Physical examination: Blood pressure 100/62, pulse 69, temperature 98.1 F (36.7 C), temperature source Oral, resp. rate 16, height 5\' 10"  (1.778 m), weight 182 lb 6.4 oz (82.7 kg), SpO2  95 %.  General: Alert, interactive, in no acute distress. HEENT: TMs pearly gray, turbinates edematous with thick discharge, post-pharynx markedly erythematous. Neck: Supple without lymphadenopathy. Lungs: Mildly decreased breath sounds bilaterally without wheezing, rhonchi or rales. CV: Normal S1, S2 without murmurs. Skin: Warm and dry, without lesions or rashes.  The  following portions of the patient's history were reviewed and updated as appropriate: allergies, current medications, past family history, past medical history, past social history, past surgical history and problem list.  Allergies as of 05/21/2016      Reactions   Adhesive [tape]       Medication List       Accurate as of 05/21/16  6:06 PM. Always use your most recent med list.          Albuterol Sulfate 108 (90 Base) MCG/ACT Aepb Commonly known as:  PROAIR RESPICLICK Inhale 2 puffs into the lungs as needed. For cough or wheeze.  May use 2 puffs 10-20 minutes prior to exercise.   Azelastine HCl 0.15 % Soln Place 2 sprays into both nostrils 2 (two) times daily.   Azelastine-Fluticasone 137-50 MCG/ACT Susp Commonly known as:  DYMISTA Use 1-2 sprays in each nostril 1-2 times daily for stuffy nose or drainage.   budesonide 0.5 MG/2ML nebulizer solution Commonly known as:  PULMICORT Take 2 mLs (0.5 mg total) by nebulization 2 (two) times daily as needed.   EPIPEN 2-PAK 0.3 mg/0.3 mL Soaj injection Generic drug:  EPINEPHrine USE AS DIRECTED FOR SEVERE ALLERGIC REACTION   fluticasone 50 MCG/ACT nasal spray Commonly known as:  FLONASE Place 2 sprays into both nostrils daily.   levocetirizine 5 MG tablet Commonly known as:  XYZAL Take 1 tablet (5 mg total) by mouth daily.   traZODone 50 MG tablet Commonly known as:  DESYREL Take 50 mg by mouth at bedtime.       Allergies  Allergen Reactions  . Adhesive [Tape]    Review of systems: Review of systems negative except as noted in HPI / PMHx or noted below: Constitutional: Negative.  HENT: Negative.   Eyes: Negative.  Respiratory: Negative.   Cardiovascular: Negative.  Gastrointestinal: Negative.  Genitourinary: Negative.  Musculoskeletal: Negative.  Neurological: Negative.  Endo/Heme/Allergies: Negative.  Cutaneous: Negative.  History reviewed. No pertinent past medical history.  History reviewed. No pertinent  family history.  Social History   Social History  . Marital status: Single    Spouse name: N/A  . Number of children: N/A  . Years of education: N/A   Occupational History  . Not on file.   Social History Main Topics  . Smoking status: Never Smoker  . Smokeless tobacco: Never Used  . Alcohol use Not on file  . Drug use: Unknown  . Sexual activity: Not on file   Other Topics Concern  . Not on file   Social History Narrative  . No narrative on file    I appreciate the opportunity to take part in Keshon's care. Please do not hesitate to contact me with questions.  Sincerely,   R. Jorene Guest, MD

## 2016-05-21 NOTE — Patient Instructions (Addendum)
Asthma with acute exacerbation  Prednisone has been provided, 40 mg x3 days, 20 mg x3 day, 10 mg x3 day, then stop.  For now and during respiratory tract infections or asthma flares, add Asmanex 200g 2 inhalations 2 times per day until symptoms have returned to baseline.   To maximize pulmonary deposition, a spacer has been provided along with instructions for its proper administration with an HFA inhaler.  Continue albuterol HFA, 1-2 inhalations every 4-6 hours as needed.  The patient has been asked to contact me if his symptoms persist or progress. Otherwise, he may return for follow up in 4 months.  Acute sinusitis  Prednisone has been provided (as above).  Start budesonide/saline nasal irrigation twice a day.  A prescription has been provided for budesonide 0.5 mg respules and instructions for mixing and adminstering the rinse have been discussed and provided in written form.  (Hold fluticasone nasal spray while using budesonide/saline nasal irrigation).  Continue azelastine nasal spray, 1-2 sprays per nostril twice daily as needed.  For thick post nasal drainage, add guaifenesin 1200 mg (Mucinex Maximum Strength)  twice daily as needed with adequate hydration as discussed.  (Hold levocetirizine while taking Mucinex, as levocetirizine may increase mucus viscosity).   The patient has been asked to contact me if his symptoms persist, progress, or if he becomes febrile.   Other allergic rhinitis  Continue appropriate allergen avoidance measures and resume aeroallergen immunotherapy injections after the asthma exacerbation has resolved.   Return in about 4 months (around 09/21/2016), or if symptoms worsen or fail to improve.    Budesonide (Pulmicort) + Saline Irrigation/Rinse  Budesonide (Pulmicort) is an anti-inflammatory steroid medication used to decrease nasal and sinus inflammation. It is dispensed in liquid form in a vial. Although it is manufactured for use with a nebulizer,  we intend for you to use it with the NeilMed Sinus Rinse bottle (preferred) or a Neti pot.   Instructions:  1) Make 240cc of saline in the NeilMed bottle using the salt packets or your own saline recipe (see separate handout).  2) Add the entire 2cc vial of liquid Budesonide (Pulmicort) to the rinse bottle and mix together.  3) While in the shower or over the sink, tilt your head forward to a comfortable level. Put the tip of the sinus rinse bottle in your nostril and aim it towards the crown or top of your head. Gently squeeze the bottle to flush out your nose. The fluid will circulate in and out of your sinus cavities, coming back out from either nostril or through your mouth. Try not to swallow large quantities and spit it out instead.  4) Perform Budesonide (Pulmicort) + Saline irrigations 2 times daily.

## 2016-05-22 ENCOUNTER — Telehealth: Payer: Self-pay | Admitting: Allergy and Immunology

## 2016-05-22 NOTE — Telephone Encounter (Signed)
Please advise 

## 2016-05-22 NOTE — Telephone Encounter (Signed)
Pt called and said the rx that was called in was too high and needs something else called in. I think it is Buddesonide. [facility] [address]. [phone].

## 2016-05-22 NOTE — Telephone Encounter (Signed)
There is no alternative to add to his nasal saline lavage. [facility] Thanks.

## 2016-05-23 MED ORDER — FLUTICASONE PROPIONATE 93 MCG/ACT NA EXHU
2.0000 | INHALANT_SUSPENSION | Freq: Two times a day (BID) | NASAL | 1 refills | Status: DC
Start: 2016-05-23 — End: 2016-12-03

## 2016-05-23 NOTE — Telephone Encounter (Signed)
Patient called again this morning. I read him the note that Dr. Nunzio CobbsBobbitt had noted. He said he would like to talk to a nurse because he doesn't know what [facility]ance is.

## 2016-05-23 NOTE — Telephone Encounter (Signed)
Yes - 2 sprays per nostril twice a day (2 bottles per month). He should get the first 2 months free. Thanks.

## 2016-05-23 NOTE — Telephone Encounter (Signed)
Patient has been informed and prescription has been sent. 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

## 2016-05-23 NOTE — Telephone Encounter (Signed)
Left message for patient to call back to discuss. I do believe his insurance will cover it. Do you want me to send it for him?

## 2016-05-25 ENCOUNTER — Telehealth: Payer: Self-pay

## 2016-05-25 NOTE — Telephone Encounter (Signed)
I spoke with [name] in regards for clarification for Jeremy [name] that it is 2 sprays per nostril.

## 2016-05-31 ENCOUNTER — Ambulatory Visit (INDEPENDENT_AMBULATORY_CARE_PROVIDER_SITE_OTHER): Payer: PRIVATE HEALTH INSURANCE | Admitting: *Deleted

## 2016-05-31 DIAGNOSIS — J309 Allergic rhinitis, unspecified: Secondary | ICD-10-CM | POA: Diagnosis not present

## 2016-06-13 ENCOUNTER — Ambulatory Visit (INDEPENDENT_AMBULATORY_CARE_PROVIDER_SITE_OTHER): Payer: PRIVATE HEALTH INSURANCE | Admitting: *Deleted

## 2016-06-13 DIAGNOSIS — J309 Allergic rhinitis, unspecified: Secondary | ICD-10-CM | POA: Diagnosis not present

## 2016-07-05 ENCOUNTER — Telehealth: Payer: Self-pay | Admitting: Allergy and Immunology

## 2016-07-05 NOTE — Telephone Encounter (Signed)
Patient cancelled his upcoming, follow up appt. He said he is doing much better. When he was last seen, he was put on [facility]hance and he said it worked and he feels better. He wants to know if he should stop this and go back to his Flonase? He said he was getting injections every two weeks and now that he is feeling better, he wants to know if he can go back to once a month injections.

## 2016-07-05 NOTE — Telephone Encounter (Signed)
Pt has been advised. Pt had no questions or concerns.

## 2016-07-05 NOTE — Telephone Encounter (Signed)
Yes, he may go to once a month injections.  Also, he can try going back to Flonase if it works well, continue, if he finds that the [facility]leda GranaFlonase is not working as well as [facility], he can return to [facility].  Thanks.

## 2016-07-05 NOTE — Telephone Encounter (Signed)
Please advise 

## 2016-07-06 ENCOUNTER — Ambulatory Visit (INDEPENDENT_AMBULATORY_CARE_PROVIDER_SITE_OTHER): Payer: PRIVATE HEALTH INSURANCE | Admitting: *Deleted

## 2016-07-06 DIAGNOSIS — J309 Allergic rhinitis, unspecified: Secondary | ICD-10-CM | POA: Diagnosis not present

## 2016-07-09 ENCOUNTER — Ambulatory Visit: Payer: PRIVATE HEALTH INSURANCE | Admitting: Allergy and Immunology

## 2016-07-26 ENCOUNTER — Ambulatory Visit (INDEPENDENT_AMBULATORY_CARE_PROVIDER_SITE_OTHER): Payer: PRIVATE HEALTH INSURANCE | Admitting: *Deleted

## 2016-07-26 DIAGNOSIS — J309 Allergic rhinitis, unspecified: Secondary | ICD-10-CM | POA: Diagnosis not present

## 2016-08-06 ENCOUNTER — Ambulatory Visit (INDEPENDENT_AMBULATORY_CARE_PROVIDER_SITE_OTHER): Payer: PRIVATE HEALTH INSURANCE

## 2016-08-06 DIAGNOSIS — J309 Allergic rhinitis, unspecified: Secondary | ICD-10-CM

## 2016-08-20 ENCOUNTER — Ambulatory Visit (INDEPENDENT_AMBULATORY_CARE_PROVIDER_SITE_OTHER): Payer: PRIVATE HEALTH INSURANCE | Admitting: *Deleted

## 2016-08-20 DIAGNOSIS — J309 Allergic rhinitis, unspecified: Secondary | ICD-10-CM | POA: Diagnosis not present

## 2016-08-29 ENCOUNTER — Ambulatory Visit (INDEPENDENT_AMBULATORY_CARE_PROVIDER_SITE_OTHER): Payer: PRIVATE HEALTH INSURANCE

## 2016-08-29 DIAGNOSIS — J309 Allergic rhinitis, unspecified: Secondary | ICD-10-CM | POA: Diagnosis not present

## 2016-09-17 ENCOUNTER — Other Ambulatory Visit: Payer: Self-pay | Admitting: Gastroenterology

## 2016-09-17 DIAGNOSIS — R140 Abdominal distension (gaseous): Secondary | ICD-10-CM

## 2016-09-17 DIAGNOSIS — R1013 Epigastric pain: Secondary | ICD-10-CM

## 2016-09-20 ENCOUNTER — Other Ambulatory Visit: Payer: Self-pay | Admitting: Gastroenterology

## 2016-09-20 ENCOUNTER — Other Ambulatory Visit: Payer: Managed Care, Other (non HMO)

## 2016-09-20 DIAGNOSIS — R1013 Epigastric pain: Secondary | ICD-10-CM

## 2016-09-21 ENCOUNTER — Ambulatory Visit
Admission: RE | Admit: 2016-09-21 | Discharge: 2016-09-21 | Disposition: A | Discharge status: Home / Self Care | Payer: PRIVATE HEALTH INSURANCE | Source: Ambulatory Visit | Attending: Gastroenterology | Admitting: Gastroenterology

## 2016-09-21 DIAGNOSIS — R1013 Epigastric pain: Secondary | ICD-10-CM

## 2016-09-24 ENCOUNTER — Other Ambulatory Visit: Payer: Self-pay | Admitting: Cardiology

## 2016-09-24 ENCOUNTER — Ambulatory Visit
Admission: RE | Admit: 2016-09-24 | Discharge: 2016-09-24 | Disposition: A | Discharge status: Home / Self Care | Payer: PRIVATE HEALTH INSURANCE | Source: Ambulatory Visit | Attending: Cardiology | Admitting: Cardiology

## 2016-09-24 DIAGNOSIS — R079 Chest pain, unspecified: Secondary | ICD-10-CM

## 2016-09-26 ENCOUNTER — Other Ambulatory Visit: Payer: Self-pay | Admitting: Cardiology

## 2016-09-26 DIAGNOSIS — R9439 Abnormal result of other cardiovascular function study: Secondary | ICD-10-CM

## 2016-09-26 DIAGNOSIS — R072 Precordial pain: Secondary | ICD-10-CM

## 2016-09-27 ENCOUNTER — Ambulatory Visit (INDEPENDENT_AMBULATORY_CARE_PROVIDER_SITE_OTHER): Payer: PRIVATE HEALTH INSURANCE

## 2016-09-27 DIAGNOSIS — J309 Allergic rhinitis, unspecified: Secondary | ICD-10-CM | POA: Diagnosis not present

## 2016-09-28 ENCOUNTER — Other Ambulatory Visit: Payer: PRIVATE HEALTH INSURANCE

## 2016-10-01 ENCOUNTER — Ambulatory Visit
Admission: RE | Admit: 2016-10-01 | Discharge: 2016-10-01 | Disposition: A | Discharge status: Home / Self Care | Payer: PRIVATE HEALTH INSURANCE | Source: Ambulatory Visit | Attending: Gastroenterology | Admitting: Gastroenterology

## 2016-10-01 DIAGNOSIS — R140 Abdominal distension (gaseous): Secondary | ICD-10-CM

## 2016-10-01 DIAGNOSIS — R1013 Epigastric pain: Secondary | ICD-10-CM

## 2016-10-01 MED ORDER — IOPAMIDOL (ISOVUE-300) INJECTION 61%
100.0000 mL | Freq: Once | INTRAVENOUS | Status: AC | PRN
  Administered 2016-10-01: 100 mL via INTRAVENOUS

## 2016-10-04 ENCOUNTER — Other Ambulatory Visit: Payer: Self-pay | Admitting: Gastroenterology

## 2016-10-04 DIAGNOSIS — R1013 Epigastric pain: Secondary | ICD-10-CM

## 2016-10-04 DIAGNOSIS — R140 Abdominal distension (gaseous): Secondary | ICD-10-CM

## 2016-10-04 DIAGNOSIS — K561 Intussusception: Secondary | ICD-10-CM

## 2016-10-04 DIAGNOSIS — K869 Disease of pancreas, unspecified: Secondary | ICD-10-CM

## 2016-10-04 DIAGNOSIS — D739 Disease of spleen, unspecified: Secondary | ICD-10-CM

## 2016-10-04 DIAGNOSIS — K769 Liver disease, unspecified: Secondary | ICD-10-CM

## 2016-10-04 DIAGNOSIS — D7389 Other diseases of spleen: Secondary | ICD-10-CM

## 2016-10-10 ENCOUNTER — Ambulatory Visit (HOSPITAL_COMMUNITY)
Admission: RE | Admit: 2016-10-10 | Discharge: 2016-10-10 | Disposition: A | Discharge status: Home / Self Care | Payer: PRIVATE HEALTH INSURANCE | Source: Ambulatory Visit | Attending: Cardiology | Admitting: Cardiology

## 2016-10-10 DIAGNOSIS — R9439 Abnormal result of other cardiovascular function study: Secondary | ICD-10-CM | POA: Diagnosis present

## 2016-10-10 DIAGNOSIS — R072 Precordial pain: Secondary | ICD-10-CM | POA: Insufficient documentation

## 2016-10-10 MED ORDER — METOPROLOL TARTRATE 5 MG/5ML IV SOLN
5.0000 mg | Freq: Once | INTRAVENOUS | Status: AC
  Administered 2016-10-10: 5 mg via INTRAVENOUS

## 2016-10-10 MED ORDER — NITROGLYCERIN 0.4 MG SL SUBL
SUBLINGUAL_TABLET | SUBLINGUAL | Status: AC
  Administered 2016-10-10: 0.4 mg
  Filled 2016-10-10: qty 2

## 2016-10-10 MED ORDER — NITROGLYCERIN 0.4 MG SL SUBL: 0.8000 mg | SUBLINGUAL_TABLET | SUBLINGUAL | Status: DC | PRN

## 2016-10-10 MED ORDER — IOPAMIDOL (ISOVUE-370) INJECTION 76%
INTRAVENOUS | Status: AC
  Filled 2016-10-10: qty 100

## 2016-10-10 MED ORDER — METOPROLOL TARTRATE 5 MG/5ML IV SOLN
INTRAVENOUS | Status: AC
  Filled 2016-10-10: qty 10

## 2016-10-10 MED ORDER — NITROGLYCERIN 0.4 MG SL SUBL: 0.4000 mg | SUBLINGUAL_TABLET | SUBLINGUAL | Status: DC | PRN

## 2016-10-12 ENCOUNTER — Ambulatory Visit
Admission: RE | Admit: 2016-10-12 | Discharge: 2016-10-12 | Disposition: A | Discharge status: Home / Self Care | Payer: PRIVATE HEALTH INSURANCE | Source: Ambulatory Visit | Attending: Gastroenterology | Admitting: Gastroenterology

## 2016-10-12 DIAGNOSIS — K769 Liver disease, unspecified: Secondary | ICD-10-CM

## 2016-10-12 DIAGNOSIS — D7389 Other diseases of spleen: Secondary | ICD-10-CM

## 2016-10-12 DIAGNOSIS — D739 Disease of spleen, unspecified: Secondary | ICD-10-CM

## 2016-10-12 DIAGNOSIS — K561 Intussusception: Secondary | ICD-10-CM

## 2016-10-12 DIAGNOSIS — R1013 Epigastric pain: Secondary | ICD-10-CM

## 2016-10-12 DIAGNOSIS — K869 Disease of pancreas, unspecified: Secondary | ICD-10-CM

## 2016-10-12 DIAGNOSIS — R140 Abdominal distension (gaseous): Secondary | ICD-10-CM

## 2016-10-12 MED ORDER — GADOBENATE DIMEGLUMINE 529 MG/ML IV SOLN
18.0000 mL | Freq: Once | INTRAVENOUS | Status: AC | PRN
  Administered 2016-10-12: 18 mL via INTRAVENOUS

## 2016-10-16 ENCOUNTER — Ambulatory Visit (INDEPENDENT_AMBULATORY_CARE_PROVIDER_SITE_OTHER): Payer: PRIVATE HEALTH INSURANCE | Admitting: *Deleted

## 2016-10-16 DIAGNOSIS — J309 Allergic rhinitis, unspecified: Secondary | ICD-10-CM

## 2016-10-20 ENCOUNTER — Other Ambulatory Visit: Payer: PRIVATE HEALTH INSURANCE

## 2016-10-25 ENCOUNTER — Ambulatory Visit (INDEPENDENT_AMBULATORY_CARE_PROVIDER_SITE_OTHER): Payer: PRIVATE HEALTH INSURANCE | Admitting: *Deleted

## 2016-10-25 DIAGNOSIS — J309 Allergic rhinitis, unspecified: Secondary | ICD-10-CM

## 2016-11-08 ENCOUNTER — Ambulatory Visit (INDEPENDENT_AMBULATORY_CARE_PROVIDER_SITE_OTHER): Payer: PRIVATE HEALTH INSURANCE | Admitting: *Deleted

## 2016-11-08 DIAGNOSIS — J309 Allergic rhinitis, unspecified: Secondary | ICD-10-CM

## 2016-11-08 NOTE — Progress Notes (Signed)
VIALS EXP 11-09-17 

## 2016-11-09 DIAGNOSIS — H9202 Otalgia, left ear: Secondary | ICD-10-CM | POA: Insufficient documentation

## 2016-11-09 DIAGNOSIS — J3089 Other allergic rhinitis: Secondary | ICD-10-CM | POA: Diagnosis not present

## 2016-11-09 DIAGNOSIS — H903 Sensorineural hearing loss, bilateral: Secondary | ICD-10-CM | POA: Insufficient documentation

## 2016-11-09 DIAGNOSIS — M2669 Other specified disorders of temporomandibular joint: Secondary | ICD-10-CM | POA: Insufficient documentation

## 2016-11-14 LAB — HM COLONOSCOPY

## 2016-11-20 ENCOUNTER — Other Ambulatory Visit: Payer: Self-pay | Admitting: Allergy and Immunology

## 2016-11-20 ENCOUNTER — Ambulatory Visit (INDEPENDENT_AMBULATORY_CARE_PROVIDER_SITE_OTHER): Payer: PRIVATE HEALTH INSURANCE

## 2016-11-20 DIAGNOSIS — J309 Allergic rhinitis, unspecified: Secondary | ICD-10-CM | POA: Diagnosis not present

## 2016-11-26 ENCOUNTER — Institutional Professional Consult (permissible substitution): Payer: PRIVATE HEALTH INSURANCE | Admitting: Pulmonary Disease

## 2016-12-03 ENCOUNTER — Encounter: Payer: Self-pay | Admitting: Allergy and Immunology

## 2016-12-03 ENCOUNTER — Ambulatory Visit (INDEPENDENT_AMBULATORY_CARE_PROVIDER_SITE_OTHER): Payer: PRIVATE HEALTH INSURANCE | Admitting: Allergy and Immunology

## 2016-12-03 VITALS — BP 100/75 | HR 75 | Temp 98.4°F | Resp 16

## 2016-12-03 DIAGNOSIS — J4520 Mild intermittent asthma, uncomplicated: Secondary | ICD-10-CM

## 2016-12-03 DIAGNOSIS — J3089 Other allergic rhinitis: Secondary | ICD-10-CM | POA: Diagnosis not present

## 2016-12-03 MED ORDER — AZELASTINE HCL 0.15 % NA SOLN: 2.0000 | Freq: Two times a day (BID) | NASAL | 0 refills | Status: DC

## 2016-12-03 MED ORDER — FLUTICASONE PROPIONATE 93 MCG/ACT NA EXHU: 2.0000 | INHALANT_SUSPENSION | Freq: Two times a day (BID) | NASAL | 1 refills | Status: DC

## 2016-12-03 MED ORDER — ALBUTEROL SULFATE 108 (90 BASE) MCG/ACT IN AEPB: 2.0000 | INHALATION_SPRAY | RESPIRATORY_TRACT | 0 refills | Status: DC | PRN

## 2016-12-03 NOTE — Patient Instructions (Addendum)
Mild intermittent asthma/exercise-induced bronchospasm Well-controlled.  Continue ProAir Respiclick every 4-6 hours as needed and 15 minutes prior to exercise.  A refill prescription has been provided for ProAir Respiclick.  Subjective and objective measures of pulmonary function will be followed and the treatment plan will be adjusted accordingly.  Other allergic rhinitis Stable.  Continue appropriate allergen avoidance measures and aeroallergen immunotherapy injections as prescribed.  Continue Xhance, 1 spray per nostril twice daily as needed, and azelastine nasal spray, 1-2 sprays per nostril twice daily as needed.  Nasal saline spray (i.e., Simply Saline) or nasal saline lavage (i.e., NeilMed) is recommended as needed and prior to medicated nasal sprays.  Refill prescriptions have been provided for Xhance and azelastine nasal spray.   Return in about 6 months (around 06/03/2017), or if symptoms worsen or fail to improve.

## 2016-12-03 NOTE — Assessment & Plan Note (Signed)
Well-controlled.  Continue ProAir Respiclick every 4-6 hours as needed and 15 minutes prior to exercise.  A refill prescription has been provided for ProAir Respiclick.  Subjective and objective measures of pulmonary function will be followed and the treatment plan will be adjusted accordingly.

## 2016-12-03 NOTE — Assessment & Plan Note (Signed)
Stable.  Continue appropriate allergen avoidance measures and aeroallergen immunotherapy injections as prescribed.  Continue Xhance, 1 spray per nostril twice daily as needed, and azelastine nasal spray, 1-2 sprays per nostril twice daily as needed.  Nasal saline spray (i.e., Simply Saline) or nasal saline lavage (i.e., NeilMed) is recommended as needed and prior to medicated nasal sprays.  Refill prescriptions have been provided for Xhance and azelastine nasal spray.

## 2016-12-03 NOTE — Progress Notes (Signed)
Follow-up Note  RE: Jeremy GoingJoey Bascom MRN: [id number] DOB: 09/30/1960 Date of Office Visit: 12/03/2016  Primary care provider: Merri BrunetteSmith, Candace, MD Referring provider: Merri BrunetteSmith, Candace, MD  History of present illness: Jeremy [name] is a 56 y.o. male with persistent asthma and allergic rhinitis presenting today for follow-up.  He was last seen in this clinic on May 21, 2016.  He reports that in the interval since his previous visit his asthma has been well controlled.  He had experienced dyspnea evaluated by cardiology stress test, which he failed.  He underwent further thorough evaluation no abnormality was found.  His nasal symptoms are well controlled with Xhance and azelastine nasal spray as needed.  He uses albuterol prior to and during vigorous exercise.   Assessment and plan: Mild intermittent asthma/exercise-induced bronchospasm Well-controlled.  Continue ProAir Respiclick every 4-6 hours as needed and 15 minutes prior to exercise.  A refill prescription has been provided for ProAir Respiclick.  Subjective and objective measures of pulmonary function will be followed and the treatment plan will be adjusted accordingly.  Other allergic rhinitis Stable.  Continue appropriate allergen avoidance measures and aeroallergen immunotherapy injections as prescribed.  Continue Xhance, 1 spray per nostril twice daily as needed, and azelastine nasal spray, 1-2 sprays per nostril twice daily as needed.  Nasal saline spray (i.e., Simply Saline) or nasal saline lavage (i.e., NeilMed) is recommended as needed and prior to medicated nasal sprays.  Refill prescriptions have been provided for Xhance and azelastine nasal spray.   Meds ordered this encounter  Medications  . Azelastine HCl 0.15 % SOLN    Sig: Place 2 sprays into both nostrils 2 (two) times daily.    Dispense:  90 mL    Refill:  0  . Fluticasone Propionate (XHANCE) 93 MCG/ACT EXHU    Sig: Place 2 sprays into the nose 2 (two) times  daily.    Dispense:  32 mL    Refill:  1  . Albuterol Sulfate (PROAIR RESPICLICK) 108 (90 Base) MCG/ACT AEPB    Sig: Inhale 2 puffs into the lungs as needed. For cough or wheeze.  May use 2 puffs 10-20 minutes prior to exercise.    Dispense:  3 each    Refill:  0    Diagnostics: Spirometry:  Normal with an FEV1 of 100% predicted.  Please see scanned spirometry results for details.    Physical examination: Blood pressure 100/75, pulse 75, temperature 98.4 F (36.9 C), temperature source Oral, resp. rate 16, SpO2 98 %.  General: Alert, interactive, in no acute distress. HEENT: TMs pearly gray, turbinates minimally edematous without discharge, post-pharynx unremarkable. Neck: Supple without lymphadenopathy. Lungs: Clear to auscultation without wheezing, rhonchi or rales. CV: Normal S1, S2 without murmurs. Skin: Warm and dry, without lesions or rashes.  The following portions of the patient's history were reviewed and updated as appropriate: allergies, current medications, past family history, past medical history, past social history, past surgical history and problem list.  Allergies as of 12/03/2016      Reactions   Adhesive [tape]       Medication List        Accurate as of 12/03/16  7:07 PM. Always use your most recent med list.          Albuterol Sulfate 108 (90 Base) MCG/ACT Aepb Commonly known as:  PROAIR RESPICLICK Inhale 2 puffs into the lungs as needed. For cough or wheeze.  May use 2 puffs 10-20 minutes prior to exercise.   Azelastine HCl  0.15 % Soln Place 2 sprays into both nostrils 2 (two) times daily.   EPIPEN 2-PAK 0.3 mg/0.3 mL Soaj injection Generic drug:  EPINEPHrine USE AS DIRECTED FOR SEVERE ALLERGIC REACTION   Fluticasone Propionate 93 MCG/ACT Exhu Commonly known as:  XHANCE Place 2 sprays into the nose 2 (two) times daily.   levocetirizine 5 MG tablet Commonly known as:  XYZAL Take 1 tablet (5 mg total) by mouth daily.   traZODone 50 MG  tablet Commonly known as:  DESYREL Take 50 mg by mouth at bedtime.       Allergies  Allergen Reactions  . Adhesive [Tape]    Review of systems: Review of systems negative except as noted in HPI / PMHx or noted below: Constitutional: Negative.  HENT: Negative.   Eyes: Negative.  Respiratory: Negative.   Cardiovascular: Negative.  Gastrointestinal: Negative.  Genitourinary: Negative.  Musculoskeletal: Negative.  Neurological: Negative.  Endo/Heme/Allergies: Negative.  Cutaneous: Negative.  No past medical history on file.  No family history on file.  Social History   Socioeconomic History  . Marital status: Married    Spouse name: Not on file  . Number of children: Not on file  . Years of education: Not on file  . Highest education level: Not on file  Social Needs  . Financial resource strain: Not on file  . Food insecurity - worry: Not on file  . Food insecurity - inability: Not on file  . Transportation needs - medical: Not on file  . Transportation needs - non-medical: Not on file  Occupational History  . Not on file  Tobacco Use  . Smoking status: Never Smoker  . Smokeless tobacco: Never Used  Substance and Sexual Activity  . Alcohol use: Not on file  . Drug use: Not on file  . Sexual activity: Not on file  Other Topics Concern  . Not on file  Social History Narrative  . Not on file    I appreciate the opportunity to take part in Rhoderick's care. Please do not hesitate to contact me with questions.  Sincerely,   R. Jorene Guestarter Adelaida Reindel, MD

## 2016-12-05 ENCOUNTER — Ambulatory Visit (INDEPENDENT_AMBULATORY_CARE_PROVIDER_SITE_OTHER): Payer: PRIVATE HEALTH INSURANCE

## 2016-12-05 DIAGNOSIS — J309 Allergic rhinitis, unspecified: Secondary | ICD-10-CM | POA: Diagnosis not present

## 2016-12-06 ENCOUNTER — Other Ambulatory Visit: Payer: Self-pay | Admitting: Orthopedic Surgery

## 2016-12-06 ENCOUNTER — Encounter: Payer: Self-pay | Admitting: Pulmonary Disease

## 2016-12-06 ENCOUNTER — Ambulatory Visit (INDEPENDENT_AMBULATORY_CARE_PROVIDER_SITE_OTHER): Payer: PRIVATE HEALTH INSURANCE | Admitting: Pulmonary Disease

## 2016-12-06 VITALS — BP 120/64 | HR 65 | Ht 71.0 in | Wt 186.0 lb

## 2016-12-06 DIAGNOSIS — R0609 Other forms of dyspnea: Secondary | ICD-10-CM | POA: Diagnosis not present

## 2016-12-06 DIAGNOSIS — M720 Palmar fascial fibromatosis [Dupuytren]: Secondary | ICD-10-CM

## 2016-12-06 NOTE — Patient Instructions (Signed)
Will arrange for pulmonary function test  Follow up in 4 weeks with Dr. Braelin Brosch or Nurse Practitioner 

## 2016-12-06 NOTE — Progress Notes (Signed)
   Subjective:    Patient ID: Jeremy [name], male    DOB: 03/22/1960, 56 y.o.   MRN: [id number]  HPI    Review of Systems  Respiratory: Positive for shortness of breath.   Gastrointestinal: Positive for abdominal distention.       Objective:   Physical Exam        Assessment & Plan:

## 2016-12-06 NOTE — Progress Notes (Signed)
Past surgical history He  has a past surgical history that includes Knee surgery; Shoulder surgery; Tonsillectomy; and Sinus surgery with Instatrak.  Family history His family history is not on file.  Social history He  reports that  has never smoked. he has never used smokeless tobacco.  Allergies  Allergen Reactions  . Adhesive [Tape]     Current Outpatient Medications on File Prior to Visit  Medication Sig  . Albuterol Sulfate (PROAIR RESPICLICK) 108 (90 Base) MCG/ACT AEPB Inhale 2 puffs into the lungs as needed. For cough or wheeze.  May use 2 puffs 10-20 minutes prior to exercise.  . Azelastine HCl 0.15 % SOLN Place 2 sprays into both nostrils 2 (two) times daily.  Marland Kitchen. EPIPEN 2-PAK 0.3 MG/0.3ML SOAJ injection USE AS DIRECTED FOR SEVERE ALLERGIC REACTION  . Fluticasone Propionate (XHANCE) 93 MCG/ACT EXHU Place 2 sprays into the nose 2 (two) times daily.  Marland Kitchen. levocetirizine (XYZAL) 5 MG tablet Take 1 tablet (5 mg total) by mouth daily.  . mometasone (ASMANEX) 220 MCG/INH inhaler Inhale 2 puffs into the lungs daily.  . sildenafil (REVATIO) 20 MG tablet Take 20 mg by mouth 3 (three) times daily.  . traZODone (DESYREL) 50 MG tablet Take 50 mg by mouth at bedtime.   Current Facility-Administered Medications on File Prior to Visit  Medication  . predniSONE (DELTASONE) tablet 10 mg    Chief Complaint  Patient presents with  . Advice Only    feeling that his lung capicity is low, has had several CT, Xrays, MRI to find problem, he runs and use to do marathons but now he can't, feels that alcohol may be the problems but has stopped 3 months ago, and is still working out    Past medical history Histoplasmosis  Vital signs BP 120/64 (BP Location: Right Arm, Cuff Size: Normal)   Pulse 65   Ht 5\' 11"  (1.803 m)   Wt 186 lb (84.4 kg)   SpO2 97%   BMI 25.94 kg/m   History of present illness Jeremy [name] is a 56 y.o. male with dyspnea on exertion.  He has a history of alcoholism.  He  was sober for several years.  He started running competitively.  He was able to run a half marathon with sub 8 minute mile times.  He then started drinking again.  He has since quit again.  He got back into running, but hasn't been able to get back to her previous running times.  He also notices more trouble with his breathing when running and doesn't feel he can get enough air in when running.  He walks about 200 flights of steps over the day while at working.  He has started to notice feeling more winded while doing this.  He has been followed by Dr. Nunzio CobbsBobbitt with allergy.  He has been on allergy shots.  He was seen by cardiology.  He had an abnormal stress test.  He then had cardiac CT that was negative for atherosclerosis.  [place].  He had terrible infection with histoplasmosis.  He was told he had enlarged glands that were pressing on his airways.  He wasn't able to do many activities as a child.  He seemed to outgrow this.  Both his parents and his step father were heavy smokers.  His father and grandfather were alcoholics and died in bar fights.  Physical exam  General - No distress Eyes - pupils reactive ENT - No sinus tenderness, no oral exudate, no  LAN, no thyromegaly, TM clear, pupils equal/reactive Cardiac - s1s2 regular, no murmur, pulses symmetric Chest - No wheeze/rales/dullness, good air entry, normal respiratory excursion Back - No focal tenderness Abd - Soft, non-tender, no organomegaly, + bowel sounds Ext - No edema Neuro - Normal strength, cranial nerves intact Skin - No rashes Psych - Normal mood, and behavior   CMP Latest Ref Rng & Units 04/24/2009  Glucose 70 - 99 mg/dL 86  BUN 6 - 23 mg/dL 16  Creatinine 0.4 - 1.5 mg/dL 0.8  Sodium 161135 - 096145 mEq/L 141  Potassium 3.5 - 5.1 mEq/L 4.1  Chloride 96 - 112 mEq/L 106     CBC Latest Ref Rng & Units 04/24/2009 04/24/2009  WBC 4.0 - 10.5 K/uL - 5.8  Hemoglobin 13.0 - 17.0 g/dL 04.513.9 40.913.5  Hematocrit 81.139.0 - 52.0 %  41.0 38.9(L)  Platelets 150 - 400 K/uL - 204     Discussion He is a Optometristcompetitive athlete.  He has history of histoplasmosis and exercise induced asthma.  He has significant second hand tobacco exposure.  He has noticed more trouble with his breathing doing exercises he used to be able to do w/o difficulty.  He had cardiac assessment that was unrevealing.   Assessment/plan  Dyspnea on exertion. - will arrange for full pulmonary function testing   Patient Instructions  Will arrange for pulmonary function test  Follow up in 4 weeks with Dr. Craige CottaSood or Nurse Practitioner    Jeremy HellingVineet Ayla Dunigan, MD [facility] Pager:  [phone] 12/06/2016, 4:13 PM

## 2016-12-07 ENCOUNTER — Other Ambulatory Visit: Payer: Self-pay | Admitting: Allergy and Immunology

## 2016-12-07 ENCOUNTER — Ambulatory Visit
Admission: RE | Admit: 2016-12-07 | Discharge: 2016-12-07 | Disposition: A | Discharge status: Home / Self Care | Payer: PRIVATE HEALTH INSURANCE | Source: Ambulatory Visit | Attending: Orthopedic Surgery | Admitting: Orthopedic Surgery

## 2016-12-07 DIAGNOSIS — J4520 Mild intermittent asthma, uncomplicated: Secondary | ICD-10-CM

## 2016-12-07 DIAGNOSIS — J3089 Other allergic rhinitis: Secondary | ICD-10-CM

## 2016-12-07 DIAGNOSIS — M720 Palmar fascial fibromatosis [Dupuytren]: Secondary | ICD-10-CM

## 2016-12-07 MED ORDER — ALBUTEROL SULFATE 108 (90 BASE) MCG/ACT IN AEPB: 2.0000 | INHALATION_SPRAY | RESPIRATORY_TRACT | 0 refills | Status: DC | PRN

## 2016-12-07 MED ORDER — AZELASTINE HCL 0.15 % NA SOLN: 2.0000 | Freq: Two times a day (BID) | NASAL | 1 refills | Status: DC

## 2016-12-07 MED ORDER — MOMETASONE FUROATE 220 MCG/INH IN AEPB: 2.0000 | INHALATION_SPRAY | Freq: Every day | RESPIRATORY_TRACT | 5 refills | Status: DC

## 2016-12-07 MED ORDER — FLUTICASONE PROPIONATE 93 MCG/ACT NA EXHU: 2.0000 | INHALANT_SUSPENSION | Freq: Two times a day (BID) | NASAL | 5 refills | Status: DC

## 2016-12-07 NOTE — Telephone Encounter (Signed)
Received fax requesting refills for Azelastine, Xhance and Asmanex. Patient was last seen 12/03/2016 and refills were sent in during this visit however they were sent to a different Pharmacy. I will send refills to [facility].

## 2016-12-07 NOTE — Telephone Encounter (Signed)
Also received a fax for refill for Proair Respiclick. I have sent that in as well.

## 2016-12-07 NOTE — Addendum Note (Signed)
Addended by: Dub MikesHICKS, Tahra Hitzeman N on: 12/07/2016 03:22 PM   Modules accepted: Orders

## 2016-12-11 ENCOUNTER — Other Ambulatory Visit: Payer: Self-pay | Admitting: Allergy and Immunology

## 2016-12-11 DIAGNOSIS — J4520 Mild intermittent asthma, uncomplicated: Secondary | ICD-10-CM

## 2016-12-11 MED ORDER — ALBUTEROL SULFATE 108 (90 BASE) MCG/ACT IN AEPB: 2.0000 | INHALATION_SPRAY | RESPIRATORY_TRACT | 0 refills | Status: DC | PRN

## 2016-12-11 NOTE — Telephone Encounter (Signed)
Received fax for 90 day supply for Proair. Request sent in.

## 2016-12-13 ENCOUNTER — Ambulatory Visit (INDEPENDENT_AMBULATORY_CARE_PROVIDER_SITE_OTHER): Payer: PRIVATE HEALTH INSURANCE | Admitting: *Deleted

## 2016-12-13 ENCOUNTER — Other Ambulatory Visit: Payer: Self-pay | Admitting: Allergy and Immunology

## 2016-12-13 DIAGNOSIS — J4520 Mild intermittent asthma, uncomplicated: Secondary | ICD-10-CM

## 2016-12-13 DIAGNOSIS — J309 Allergic rhinitis, unspecified: Secondary | ICD-10-CM

## 2016-12-17 ENCOUNTER — Institutional Professional Consult (permissible substitution): Payer: PRIVATE HEALTH INSURANCE | Admitting: Pulmonary Disease

## 2016-12-19 ENCOUNTER — Ambulatory Visit (INDEPENDENT_AMBULATORY_CARE_PROVIDER_SITE_OTHER): Payer: PRIVATE HEALTH INSURANCE | Admitting: Family Medicine

## 2016-12-19 ENCOUNTER — Telehealth: Payer: Self-pay | Admitting: Allergy and Immunology

## 2016-12-19 DIAGNOSIS — J3089 Other allergic rhinitis: Secondary | ICD-10-CM

## 2016-12-19 DIAGNOSIS — J4520 Mild intermittent asthma, uncomplicated: Secondary | ICD-10-CM

## 2016-12-19 DIAGNOSIS — J309 Allergic rhinitis, unspecified: Secondary | ICD-10-CM

## 2016-12-19 MED ORDER — FLUTICASONE PROPIONATE 93 MCG/ACT NA EXHU: 2.0000 | INHALANT_SUSPENSION | Freq: Two times a day (BID) | NASAL | 1 refills | Status: DC

## 2016-12-19 MED ORDER — MOMETASONE FUROATE 220 MCG/INH IN AEPB: 2.0000 | INHALATION_SPRAY | Freq: Every day | RESPIRATORY_TRACT | 2 refills | Status: DC

## 2016-12-19 MED ORDER — AZELASTINE HCL 0.15 % NA SOLN: 2.0000 | Freq: Two times a day (BID) | NASAL | 1 refills | Status: DC

## 2016-12-19 MED ORDER — ALBUTEROL SULFATE 108 (90 BASE) MCG/ACT IN AEPB: 2.0000 | INHALATION_SPRAY | RESPIRATORY_TRACT | 1 refills | Status: DC | PRN

## 2016-12-19 MED ORDER — LEVOCETIRIZINE DIHYDROCHLORIDE 5 MG PO TABS: 5.0000 mg | ORAL_TABLET | Freq: Every day | ORAL | 1 refills | Status: DC

## 2016-12-19 NOTE — Telephone Encounter (Signed)
Patient called and said a whole list of medication was suppose to be sent in to [facility] before the end of the year so they would be covered. [facility]. He said he doesn't have [place]. May have had it years ago. He would like a call back to straighten this out because he needs these by year end through [facility].

## 2016-12-19 NOTE — Telephone Encounter (Signed)
Patient has been informed that prescriptions have been sent for [facility] day supplies to [facility]

## 2016-12-20 ENCOUNTER — Other Ambulatory Visit: Payer: Self-pay | Admitting: Orthopedic Surgery

## 2016-12-20 DIAGNOSIS — N6310 Unspecified lump in the right breast, unspecified quadrant: Secondary | ICD-10-CM

## 2016-12-20 DIAGNOSIS — N6320 Unspecified lump in the left breast, unspecified quadrant: Secondary | ICD-10-CM

## 2016-12-21 ENCOUNTER — Other Ambulatory Visit: Payer: Self-pay | Admitting: Family Medicine

## 2016-12-21 ENCOUNTER — Other Ambulatory Visit: Payer: Self-pay | Admitting: Orthopedic Surgery

## 2016-12-21 ENCOUNTER — Ambulatory Visit
Admission: RE | Admit: 2016-12-21 | Discharge: 2016-12-21 | Disposition: A | Discharge status: Home / Self Care | Payer: PRIVATE HEALTH INSURANCE | Source: Ambulatory Visit | Attending: Family Medicine | Admitting: Family Medicine

## 2016-12-21 ENCOUNTER — Ambulatory Visit
Admission: RE | Admit: 2016-12-21 | Discharge: 2016-12-21 | Disposition: A | Discharge status: Home / Self Care | Payer: PRIVATE HEALTH INSURANCE | Source: Ambulatory Visit | Attending: Orthopedic Surgery | Admitting: Orthopedic Surgery

## 2016-12-21 DIAGNOSIS — N6320 Unspecified lump in the left breast, unspecified quadrant: Secondary | ICD-10-CM

## 2016-12-24 ENCOUNTER — Other Ambulatory Visit: Payer: PRIVATE HEALTH INSURANCE

## 2016-12-24 ENCOUNTER — Other Ambulatory Visit: Payer: Self-pay | Admitting: Family Medicine

## 2016-12-24 DIAGNOSIS — N6320 Unspecified lump in the left breast, unspecified quadrant: Secondary | ICD-10-CM

## 2016-12-26 ENCOUNTER — Other Ambulatory Visit: Payer: PRIVATE HEALTH INSURANCE

## 2016-12-27 ENCOUNTER — Ambulatory Visit (INDEPENDENT_AMBULATORY_CARE_PROVIDER_SITE_OTHER): Payer: PRIVATE HEALTH INSURANCE | Admitting: *Deleted

## 2016-12-27 ENCOUNTER — Other Ambulatory Visit: Payer: Self-pay | Admitting: Family Medicine

## 2016-12-27 DIAGNOSIS — J309 Allergic rhinitis, unspecified: Secondary | ICD-10-CM | POA: Diagnosis not present

## 2016-12-27 DIAGNOSIS — N6320 Unspecified lump in the left breast, unspecified quadrant: Secondary | ICD-10-CM

## 2016-12-28 ENCOUNTER — Other Ambulatory Visit: Payer: Self-pay | Admitting: Family Medicine

## 2016-12-28 ENCOUNTER — Ambulatory Visit
Admission: RE | Admit: 2016-12-28 | Discharge: 2016-12-28 | Disposition: A | Discharge status: Home / Self Care | Payer: PRIVATE HEALTH INSURANCE | Source: Ambulatory Visit | Attending: Family Medicine | Admitting: Family Medicine

## 2016-12-28 ENCOUNTER — Ambulatory Visit
Admission: RE | Admit: 2016-12-28 | Discharge: 2016-12-28 | Disposition: A | Discharge status: Home / Self Care | Payer: PRIVATE HEALTH INSURANCE | Source: Ambulatory Visit | Attending: Orthopedic Surgery | Admitting: Orthopedic Surgery

## 2016-12-28 DIAGNOSIS — N6320 Unspecified lump in the left breast, unspecified quadrant: Secondary | ICD-10-CM

## 2016-12-31 ENCOUNTER — Encounter: Payer: Self-pay | Admitting: Acute Care

## 2016-12-31 ENCOUNTER — Ambulatory Visit (INDEPENDENT_AMBULATORY_CARE_PROVIDER_SITE_OTHER): Payer: PRIVATE HEALTH INSURANCE

## 2016-12-31 ENCOUNTER — Ambulatory Visit (INDEPENDENT_AMBULATORY_CARE_PROVIDER_SITE_OTHER): Payer: PRIVATE HEALTH INSURANCE | Admitting: Pulmonary Disease

## 2016-12-31 ENCOUNTER — Ambulatory Visit (INDEPENDENT_AMBULATORY_CARE_PROVIDER_SITE_OTHER): Payer: PRIVATE HEALTH INSURANCE | Admitting: Acute Care

## 2016-12-31 DIAGNOSIS — J309 Allergic rhinitis, unspecified: Secondary | ICD-10-CM

## 2016-12-31 DIAGNOSIS — R0609 Other forms of dyspnea: Secondary | ICD-10-CM

## 2016-12-31 LAB — PULMONARY FUNCTION TEST
DL/VA % PRED: 118 %
DL/VA: 5.54 ml/min/mmHg/L
DLCO COR % PRED: 104 %
DLCO COR: 35.1 ml/min/mmHg
DLCO UNC % PRED: 97 %
DLCO UNC: 32.95 ml/min/mmHg
FEF 25-75 PRE: 2.7 L/s
FEF 25-75 Post: 4.28 L/sec
FEF2575-%Change-Post: 58 %
FEF2575-%PRED-POST: 135 %
FEF2575-%Pred-Pre: 85 %
FEV1-%Change-Post: 12 %
FEV1-%PRED-POST: 100 %
FEV1-%Pred-Pre: 89 %
FEV1-PRE: 3.39 L
FEV1-Post: 3.81 L
FEV1FVC-%Change-Post: 8 %
FEV1FVC-%PRED-PRE: 100 %
FEV6-%CHANGE-POST: 4 %
FEV6-%PRED-POST: 96 %
FEV6-%Pred-Pre: 91 %
FEV6-POST: 4.61 L
FEV6-PRE: 4.4 L
FEV6FVC-%CHANGE-POST: 0 %
FEV6FVC-%PRED-POST: 103 %
FEV6FVC-%PRED-PRE: 102 %
FVC-%Change-Post: 4 %
FVC-%Pred-Post: 92 %
FVC-%Pred-Pre: 88 %
FVC-Post: 4.63 L
FVC-Pre: 4.45 L
POST FEV1/FVC RATIO: 82 %
Post FEV6/FVC ratio: 100 %
Pre FEV1/FVC ratio: 76 %
Pre FEV6/FVC Ratio: 99 %

## 2016-12-31 NOTE — Progress Notes (Signed)
History of Present Illness Jeremy [name] is a 56 y.o. male with history of histoplasmosis, alcoholism, allergies  and dyspnea on exertion. He is followed by Dr. Halford Chessman.   HPI Patient  has a history of alcoholism.  He was sober for several years.  He started running competitively.  He was able to run a half marathon with sub 8 minute mile times.  He then started drinking again.  He has since quit again.  He got back into running, but hasn't been able to get back to her previous running times.  He also notices more trouble with his breathing when running and doesn't feel he can get enough air in when running.  He walks about 200 flights of steps over the day while at working.  He has started to notice feeling more winded while doing this.  He has been followed by Dr. Verlin Fester with allergy.  He has been on allergy shots.  He was seen by cardiology.  He had an abnormal stress test.  He then had cardiac CT that was negative for atherosclerosis.  He is from [place].  He had terrible infection with histoplasmosis as a child.  He was told he had enlarged glands that were pressing on his airways.  He wasn't able to do many activities as a child.  He seemed to outgrow this.  Both his parents and his step father were heavy smokers.  His father and grandfather were alcoholics and died in bar fights.  Jan 05, 2017 Follow up for PFT results Pt presents for follow up . He states he is at his baseline. He states he has been compliant with his allergy medications  And injections but he has not been using his asthmanex. He states he does use his albuterol inhaler for shortness of breath as needed. He is continuing to run. He states he got biopsy results of a breast mass today 2023/01/06 and they were negative for cancer, so he is relieved.He denies fever, chest pain, orthopnea or hemoptysis   Test Results: CXR 09/25/2016>> The lungs are well-expanded. There is no focal infiltrate. There is no pleural effusion. The heart and  pulmonary vascularity are normal. There is S shaped thoracolumbar curvature. There appears to be some bony bridging between the inter lateral aspects of the left third and fourth ribs.  IMPRESSION: There is no acute cardiopulmonary abnormality.  CBC Latest Ref Rng & Units 04/24/2009 04/24/2009  WBC 4.0 - 10.5 K/uL - 5.8  Hemoglobin 13.0 - 17.0 g/dL 13.9 13.5  Hematocrit 39.0 - 52.0 % 41.0 38.9(L)  Platelets 150 - 400 K/uL - 204    BMP Latest Ref Rng & Units 04/24/2009  Glucose 70 - 99 mg/dL 86  BUN 6 - 23 mg/dL 16  Creatinine 0.4 - 1.5 mg/dL 0.8  Sodium 135 - 145 mEq/L 141  Potassium 3.5 - 5.1 mEq/L 4.1  Chloride 96 - 112 mEq/L 106    BNP No results found for: BNP  ProBNP No results found for: PROBNP  PFT    Component Value Date/Time   FEV1PRE 3.39 01/05/2017 1008   FEV1POST 3.81 01/05/17 1008   FVCPRE 4.45 01-05-2017 1008   FVCPOST 4.63 01-05-2017 1008   DLCOUNC 32.95 01/05/2017 1008   PREFEV1FVCRT 76 2017-01-05 1008   PSTFEV1FVCRT 82 01/05/2017 1008    US Breast Ltd Uni Left Inc Axilla  Result Date: 12/21/2016 CLINICAL DATA:  56 year old male with new left breast palpable abnormality for 1 day. Patient has a family history of metastatic adenocarcinoma of  unknown primary in mother. EXAM: 2D DIGITAL DIAGNOSTIC BILATERAL MAMMOGRAM WITH CAD AND ADJUNCT TOMO ULTRASOUND LEFT BREAST COMPARISON:  Previous exam(s). ACR Breast Density Category a: The breast tissue is almost entirely fatty. FINDINGS: A radiopaque BB was placed at the site of the patient's palpable abnormality in the upper outer quadrant at posterior depth. A focal asymmetry with associated coarse calcification is demonstrated deep to the radiopaque BB. No other suspicious masses or calcifications are identified in either breast. Mammographic images were processed with CAD. On physical exam, I palpate a firm immobile 1 cm mass at the site of the patient's palpable abnormality. Targeted ultrasound is performed,  showing an ill-defined hyperechoic area with internal hypoechoic foci of at the 11 o'clock position 10 cm from the nipple. It measures 1.4 x 1.1 x 0.7 cm. There is no internal vascularity. This correlates with the palpable abnormality and mammographic finding. Additional evaluation of the left axilla demonstrates no suspicious lymphadenopathy. IMPRESSION: Indeterminate left breast mass corresponding to the patient's palpable abnormality. The finding could represent fat necrosis, though the patient does not have a history of recent trauma to the area. Recommendation is for definitive tissue diagnosis given family history. RECOMMENDATION: Ultrasound-guided biopsy of palpable left breast mass. I have discussed the findings and recommendations with the patient. Results were also provided in writing at the conclusion of the visit. If applicable, a reminder letter will be sent to the patient regarding the next appointment. BI-RADS CATEGORY  4: Suspicious. Electronically Signed   By: Kristopher Oppenheim M.D.   On: 12/21/2016 16:30   Mm Diag Breast Tomo Bilateral  Result Date: 12/21/2016 CLINICAL DATA:  56 year old male with new left breast palpable abnormality for 1 day. Patient has a family history of metastatic adenocarcinoma of unknown primary in mother. EXAM: 2D DIGITAL DIAGNOSTIC BILATERAL MAMMOGRAM WITH CAD AND ADJUNCT TOMO ULTRASOUND LEFT BREAST COMPARISON:  Previous exam(s). ACR Breast Density Category a: The breast tissue is almost entirely fatty. FINDINGS: A radiopaque BB was placed at the site of the patient's palpable abnormality in the upper outer quadrant at posterior depth. A focal asymmetry with associated coarse calcification is demonstrated deep to the radiopaque BB. No other suspicious masses or calcifications are identified in either breast. Mammographic images were processed with CAD. On physical exam, I palpate a firm immobile 1 cm mass at the site of the patient's palpable abnormality. Targeted  ultrasound is performed, showing an ill-defined hyperechoic area with internal hypoechoic foci of at the 11 o'clock position 10 cm from the nipple. It measures 1.4 x 1.1 x 0.7 cm. There is no internal vascularity. This correlates with the palpable abnormality and mammographic finding. Additional evaluation of the left axilla demonstrates no suspicious lymphadenopathy. IMPRESSION: Indeterminate left breast mass corresponding to the patient's palpable abnormality. The finding could represent fat necrosis, though the patient does not have a history of recent trauma to the area. Recommendation is for definitive tissue diagnosis given family history. RECOMMENDATION: Ultrasound-guided biopsy of palpable left breast mass. I have discussed the findings and recommendations with the patient. Results were also provided in writing at the conclusion of the visit. If applicable, a reminder letter will be sent to the patient regarding the next appointment. BI-RADS CATEGORY  4: Suspicious. Electronically Signed   By: Kristopher Oppenheim M.D.   On: 12/21/2016 16:30   [place] Lt Upper Extrem Ltd Soft Tissue Non Vascular  Result Date: 12/07/2016 CLINICAL DATA:  Knot on the palm of the left hand at the mid third metacarpal. EXAM:  ULTRASOUND LEFT UPPER EXTREMITY LIMITED TECHNIQUE: Ultrasound examination of the upper extremity soft tissues was performed in the area of clinical concern. COMPARISON:  None. FINDINGS: Scanning was directed toward the region of concern. An intermediate to slightly hypoechoic subcutaneous nodule measuring 0.8 x 0.4 x 0.5 cm is identified. No flow is identified in association with the lesion. IMPRESSION: Small subcutaneous nodule on the region of concern could be related to the patient's history of Dupuytren's contracture but is nonspecific. The lesion does not appear aggressive. Electronically Signed   By: Inge Rise M.D.   On: 12/07/2016 17:38   Mm Clip Placement Left  Result Date: 12/28/2016 CLINICAL  DATA:  Status post ultrasound-guided biopsy of a left breast mass earlier today. EXAM: DIAGNOSTIC LEFT MAMMOGRAM POST ULTRASOUND BIOPSY COMPARISON:  Previous exam(s). FINDINGS: Mammographic images were obtained following ultrasound guided biopsy of a left breast mass in the left breast at the 11 o'clock axis. Ribbon shaped clip placed at the conclusion of the procedure is appropriately positioned at the site of the targeted mass. IMPRESSION: Ribbon shaped biopsy clip well-positioned at the site of the targeted mass. Final Assessment: Post Procedure Mammograms for Marker Placement Electronically Signed   By: Franki Cabot M.D.   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 This was found to be concordant by Dr. Franki Cabot. Pathology results were discussed with the patient by telephone. The patient reported doing well after the biopsy with tenderness and bruising at the site. Post biopsy instructions and care were reviewed and questions were answered. The patient was encouraged to call [facility] for any additional concerns. The patient was instructed to return for left diagnostic mammography in 6 months for adjacent calcifications (suspected associated fat necrosis calcifications) and informed a reminder notice would be sent regarding this appointment. Pathology results reported by Terie Purser, RN on 12/31/2016. Electronically Signed   By: Franki Cabot M.D.   On: 12/31/2016 10:46   Result Date: 12/31/2016 CLINICAL DATA:  Patient with a left breast mass presents for ultrasound-guided core biopsy. EXAM: ULTRASOUND GUIDED LEFT BREAST CORE NEEDLE BIOPSY COMPARISON:  Previous exam(s). PROCEDURE: I met with the patient and we discussed the procedure of ultrasound-guided biopsy, including benefits and  alternatives. We discussed the high likelihood of a successful procedure. We discussed the risks of the procedure including infection, bleeding, tissue injury, clip migration, and inadequate sampling. Informed written consent was given. The usual time-out protocol was performed immediately prior to the procedure. Lesion quadrant: Upper inner quadrant Using sterile technique and 1% Lidocaine as local anesthetic, under direct ultrasound visualization, a 12 gauge spring-loaded device was used to perform biopsy of the mixed echogenicity mass within the left breast at the 11 o'clock axisusing a lateral approach. At the conclusion of the procedure, a ribbon shaped tissue marker clip was deployed into the biopsy cavity. Follow-up 2-view mammogram was performed and dictated separately. IMPRESSION: Ultrasound-guided biopsy of the left breast mass at the 11 o'clock axis. No apparent complications. Electronically Signed: By: Franki Cabot M.D. On: 12/28/2016 13:27     Past medical hx History reviewed. No pertinent past medical history.   Social History   Tobacco Use  . Smoking status: Never Smoker  . Smokeless tobacco: Never Used  Substance Use Topics  . Alcohol use: Not  on file  . Drug use: Not on file    Mr.Knaak reports that  has never smoked. he has never used smokeless tobacco.  Tobacco Cessation: Never smoker  Past surgical hx, Family hx, Social hx all reviewed.  Current Outpatient Medications on File Prior to Visit  Medication Sig  . Albuterol Sulfate (PROAIR RESPICLICK) 914 (90 Base) MCG/ACT AEPB Inhale 2 puffs into the lungs every 4 (four) hours as needed. For cough or wheeze.  May use 2 puffs 10-20 minutes prior to exercise.  . Azelastine HCl 0.15 % SOLN Place 2 sprays into both nostrils 2 (two) times daily.  Marland Kitchen EPIPEN 2-PAK 0.3 MG/0.3ML SOAJ injection USE AS DIRECTED FOR SEVERE ALLERGIC REACTION  . Fluticasone Propionate (XHANCE) 93 MCG/ACT EXHU Place 2 sprays into the nose 2 (two)  times daily.  Marland Kitchen levocetirizine (XYZAL) 5 MG tablet Take 1 tablet (5 mg total) by mouth daily.  . mometasone (ASMANEX) 220 MCG/INH inhaler Inhale 2 puffs into the lungs daily.  . sildenafil (REVATIO) 20 MG tablet Take 20 mg by mouth 3 (three) times daily.  . traZODone (DESYREL) 50 MG tablet Take 50 mg by mouth at bedtime.   Current Facility-Administered Medications on File Prior to Visit  Medication  . predniSONE (DELTASONE) tablet 10 mg     Allergies  Allergen Reactions  . Adhesive [Tape]     Review Of Systems:  Constitutional:   No  weight loss, night sweats,  Fevers, chills, fatigue, or  lassitude.  HEENT:   No headaches,  Difficulty swallowing,  Tooth/dental problems, or  Sore throat,                No sneezing, itching, ear ache, nasal congestion, post nasal drip,   CV:  No chest pain,  Orthopnea, PND, swelling in lower extremities, anasarca, dizziness, palpitations, syncope.   GI  No heartburn, indigestion, abdominal pain, nausea, vomiting, diarrhea, change in bowel habits, loss of appetite, bloody stools.   Resp: + shortness of breath with exertion none  at rest.  No excess mucus, no productive cough,  No non-productive cough,  No coughing up of blood.  No change in color of mucus.  No wheezing.  No chest wall deformity  Skin: no rash or lesions.  GU: no dysuria, change in color of urine, no urgency or frequency.  No flank pain, no hematuria   MS:  No joint pain or swelling.  No decreased range of motion.  No back pain.  Psych:  No change in mood or affect. No depression or anxiety.  No memory loss.   Vital Signs BP 126/60 (BP Location: Right Arm, Cuff Size: Normal)   Pulse (!) 57   Ht '5\' 11"'  (1.803 m)   Wt 182 lb (82.6 kg)   SpO2 98%   BMI 25.38 kg/m    Physical Exam:  General- No distress,  A&Ox3, pleasant and fit ENT: No sinus tenderness, TM clear, pale nasal mucosa, no oral exudate,no post nasal drip, no LAN Cardiac: S1, S2, regular rate and rhythm, no  murmur Chest: No wheeze/ rales/ dullness; no accessory muscle use, no nasal flaring, no sternal retractions Abd.: Soft Non-tender, non-distended Ext: No clubbing cyanosis, edema Neuro:  normal strength Skin: No rashes, warm and dry Psych: normal mood and behavior  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  He has history of histoplasmosis and exercise induced asthma.  He has significant second hand tobacco exposure.  He has noticed more trouble with his breathing doing exercises he used  to be able to do w/o difficulty.  He had cardiac assessment that was unrevealing.PFT's show mild restriction with normal CXR.    Assessment/Plan  Dyspnea on exertion Your pulmonary function is normal with mild restriction + BD response Continue your Astelin, Xyzol and Fluticasone as you have been doing. Continue using your Pro Air as needed up to every 4-6 hours for shortness of breath Use Asmanex 2 puffs once daily  Every day Rinse mouth after use. Follow Up as needed. Please contact office for sooner follow up if symptoms do not improve or worsen or seek emergency care    Magdalen Spatz, NP 12/31/2016  3:53 PM

## 2016-12-31 NOTE — Progress Notes (Signed)
PFT done today. 

## 2016-12-31 NOTE — Patient Instructions (Addendum)
It is nice to meet you today. Your pulmonary function is normal with mild restriction Continue your Astelin, Xyzol and Fluticasone as you have been doing. Continue using your Pro Air as needed up to every 4-6 hours for shortness of breath Use Asmanex 2 puffs once daily  Rinse mouth after use. Follow Up as needed. Please contact office for sooner follow up if symptoms do not improve or worsen or seek emergency care

## 2016-12-31 NOTE — Assessment & Plan Note (Signed)
Your pulmonary function is normal with mild restriction + BD response Continue your Astelin, Xyzol and Fluticasone as you have been doing. Continue using your Pro Air as needed up to every 4-6 hours for shortness of breath Use Asmanex 2 puffs once daily  Every day Rinse mouth after use. Follow Up as needed. Please contact office for sooner follow up if symptoms do not improve or worsen or seek emergency care

## 2017-01-30 ENCOUNTER — Ambulatory Visit (INDEPENDENT_AMBULATORY_CARE_PROVIDER_SITE_OTHER): Payer: PRIVATE HEALTH INSURANCE | Admitting: *Deleted

## 2017-01-30 DIAGNOSIS — J309 Allergic rhinitis, unspecified: Secondary | ICD-10-CM

## 2017-02-28 ENCOUNTER — Ambulatory Visit (INDEPENDENT_AMBULATORY_CARE_PROVIDER_SITE_OTHER): Payer: PRIVATE HEALTH INSURANCE | Admitting: *Deleted

## 2017-02-28 DIAGNOSIS — J309 Allergic rhinitis, unspecified: Secondary | ICD-10-CM

## 2017-03-28 ENCOUNTER — Ambulatory Visit (INDEPENDENT_AMBULATORY_CARE_PROVIDER_SITE_OTHER): Payer: PRIVATE HEALTH INSURANCE | Admitting: *Deleted

## 2017-03-28 DIAGNOSIS — J309 Allergic rhinitis, unspecified: Secondary | ICD-10-CM

## 2017-04-17 ENCOUNTER — Encounter: Payer: Self-pay | Admitting: *Deleted

## 2017-04-17 NOTE — Progress Notes (Signed)
Maintenance vial made. Exp: 04-18-18. hv 

## 2017-04-19 DIAGNOSIS — J3089 Other allergic rhinitis: Secondary | ICD-10-CM | POA: Diagnosis not present

## 2017-04-24 ENCOUNTER — Ambulatory Visit (INDEPENDENT_AMBULATORY_CARE_PROVIDER_SITE_OTHER): Payer: PRIVATE HEALTH INSURANCE

## 2017-04-24 DIAGNOSIS — J309 Allergic rhinitis, unspecified: Secondary | ICD-10-CM | POA: Diagnosis not present

## 2017-05-30 ENCOUNTER — Ambulatory Visit (INDEPENDENT_AMBULATORY_CARE_PROVIDER_SITE_OTHER): Payer: PRIVATE HEALTH INSURANCE | Admitting: *Deleted

## 2017-05-30 DIAGNOSIS — J309 Allergic rhinitis, unspecified: Secondary | ICD-10-CM

## 2017-06-25 ENCOUNTER — Ambulatory Visit (INDEPENDENT_AMBULATORY_CARE_PROVIDER_SITE_OTHER): Payer: PRIVATE HEALTH INSURANCE | Admitting: *Deleted

## 2017-06-25 DIAGNOSIS — J309 Allergic rhinitis, unspecified: Secondary | ICD-10-CM | POA: Diagnosis not present

## 2017-07-09 ENCOUNTER — Other Ambulatory Visit: Payer: Self-pay | Admitting: Allergy and Immunology

## 2017-07-11 ENCOUNTER — Other Ambulatory Visit: Payer: Self-pay | Admitting: *Deleted

## 2017-07-11 ENCOUNTER — Telehealth: Payer: Self-pay | Admitting: Allergy and Immunology

## 2017-07-11 ENCOUNTER — Ambulatory Visit (INDEPENDENT_AMBULATORY_CARE_PROVIDER_SITE_OTHER): Payer: PRIVATE HEALTH INSURANCE | Admitting: *Deleted

## 2017-07-11 DIAGNOSIS — J309 Allergic rhinitis, unspecified: Secondary | ICD-10-CM

## 2017-07-11 DIAGNOSIS — J4520 Mild intermittent asthma, uncomplicated: Secondary | ICD-10-CM

## 2017-07-11 DIAGNOSIS — J3089 Other allergic rhinitis: Secondary | ICD-10-CM

## 2017-07-11 MED ORDER — EPIPEN 2-PAK 0.3 MG/0.3ML IJ SOAJ: INTRAMUSCULAR | 1 refills | Status: DC

## 2017-07-11 MED ORDER — AZELASTINE HCL 0.15 % NA SOLN: 2.0000 | Freq: Two times a day (BID) | NASAL | 2 refills | Status: DC

## 2017-07-11 MED ORDER — FLUTICASONE PROPIONATE 93 MCG/ACT NA EXHU: 2.0000 | INHALANT_SUSPENSION | Freq: Two times a day (BID) | NASAL | 2 refills | Status: DC

## 2017-07-11 MED ORDER — ALBUTEROL SULFATE 108 (90 BASE) MCG/ACT IN AEPB: 2.0000 | INHALATION_SPRAY | RESPIRATORY_TRACT | 1 refills | Status: DC | PRN

## 2017-07-11 MED ORDER — MOMETASONE FUROATE 220 MCG/INH IN AEPB: 2.0000 | INHALATION_SPRAY | Freq: Every day | RESPIRATORY_TRACT | 2 refills | Status: DC

## 2017-07-11 MED ORDER — LEVOCETIRIZINE DIHYDROCHLORIDE 5 MG PO TABS: 5.0000 mg | ORAL_TABLET | Freq: Every day | ORAL | 2 refills | Status: DC

## 2017-07-11 NOTE — Telephone Encounter (Signed)
I called and left a voicemail for the patient informing him that we have refilled his medications and asking for him to call back and schedule an appointment for December.

## 2017-07-11 NOTE — Telephone Encounter (Signed)
Yes. Please refill his meds and have him come in for a follow up once a year. Thanks.

## 2017-07-11 NOTE — Telephone Encounter (Signed)
Dr Bobbitt please advise 

## 2017-07-11 NOTE — Telephone Encounter (Signed)
Patient was wanting a refill on medications It was denied because patient has not been seen since Dec 2018 Patient wants to know if he can be switched to being seen yearly instead of every six months due to cost, and him being in "maintenance mode" and only needing to be seen for med refills. He has NO issues with getting an exam once a year and getting refills throughout the year. Can he make an appt for December 2019 and have his meds sent in for a year instead of six months??

## 2017-07-23 ENCOUNTER — Ambulatory Visit (INDEPENDENT_AMBULATORY_CARE_PROVIDER_SITE_OTHER): Payer: PRIVATE HEALTH INSURANCE | Admitting: *Deleted

## 2017-07-23 DIAGNOSIS — J309 Allergic rhinitis, unspecified: Secondary | ICD-10-CM | POA: Diagnosis not present

## 2017-08-06 ENCOUNTER — Ambulatory Visit (INDEPENDENT_AMBULATORY_CARE_PROVIDER_SITE_OTHER): Payer: PRIVATE HEALTH INSURANCE | Admitting: *Deleted

## 2017-08-06 DIAGNOSIS — J309 Allergic rhinitis, unspecified: Secondary | ICD-10-CM

## 2017-08-20 ENCOUNTER — Other Ambulatory Visit: Payer: Self-pay | Admitting: Allergy and Immunology

## 2017-08-23 ENCOUNTER — Ambulatory Visit (INDEPENDENT_AMBULATORY_CARE_PROVIDER_SITE_OTHER): Payer: PRIVATE HEALTH INSURANCE

## 2017-08-23 DIAGNOSIS — J309 Allergic rhinitis, unspecified: Secondary | ICD-10-CM

## 2017-09-19 ENCOUNTER — Ambulatory Visit (INDEPENDENT_AMBULATORY_CARE_PROVIDER_SITE_OTHER): Payer: PRIVATE HEALTH INSURANCE | Admitting: *Deleted

## 2017-09-19 DIAGNOSIS — J309 Allergic rhinitis, unspecified: Secondary | ICD-10-CM | POA: Diagnosis not present

## 2017-10-29 ENCOUNTER — Ambulatory Visit (INDEPENDENT_AMBULATORY_CARE_PROVIDER_SITE_OTHER): Payer: PRIVATE HEALTH INSURANCE | Admitting: *Deleted

## 2017-10-29 DIAGNOSIS — J309 Allergic rhinitis, unspecified: Secondary | ICD-10-CM

## 2017-11-26 ENCOUNTER — Ambulatory Visit (INDEPENDENT_AMBULATORY_CARE_PROVIDER_SITE_OTHER): Payer: PRIVATE HEALTH INSURANCE | Admitting: *Deleted

## 2017-11-26 DIAGNOSIS — J309 Allergic rhinitis, unspecified: Secondary | ICD-10-CM

## 2017-12-10 DIAGNOSIS — J3089 Other allergic rhinitis: Secondary | ICD-10-CM

## 2017-12-10 NOTE — Progress Notes (Signed)
VIALS EXP 12-11-18 

## 2017-12-27 ENCOUNTER — Ambulatory Visit (INDEPENDENT_AMBULATORY_CARE_PROVIDER_SITE_OTHER): Payer: PRIVATE HEALTH INSURANCE

## 2017-12-27 DIAGNOSIS — J309 Allergic rhinitis, unspecified: Secondary | ICD-10-CM

## 2018-01-21 ENCOUNTER — Ambulatory Visit (INDEPENDENT_AMBULATORY_CARE_PROVIDER_SITE_OTHER): Payer: BC Managed Care – PPO | Admitting: *Deleted

## 2018-01-21 DIAGNOSIS — J309 Allergic rhinitis, unspecified: Secondary | ICD-10-CM

## 2018-02-03 ENCOUNTER — Encounter: Payer: Self-pay | Admitting: Physician Assistant

## 2018-02-03 ENCOUNTER — Other Ambulatory Visit: Payer: Self-pay

## 2018-02-03 ENCOUNTER — Other Ambulatory Visit (HOSPITAL_COMMUNITY)
Admission: RE | Admit: 2018-02-03 | Discharge: 2018-02-03 | Disposition: A | Discharge status: Home / Self Care | Payer: BLUE CROSS/BLUE SHIELD | Source: Ambulatory Visit | Attending: Physician Assistant | Admitting: Physician Assistant

## 2018-02-03 ENCOUNTER — Ambulatory Visit (INDEPENDENT_AMBULATORY_CARE_PROVIDER_SITE_OTHER): Payer: BLUE CROSS/BLUE SHIELD | Admitting: Physician Assistant

## 2018-02-03 VITALS — BP 108/78 | HR 90 | Temp 97.7°F | Resp 16 | Ht 70.0 in | Wt 183.0 lb

## 2018-02-03 DIAGNOSIS — N529 Male erectile dysfunction, unspecified: Secondary | ICD-10-CM | POA: Diagnosis not present

## 2018-02-03 DIAGNOSIS — Z7251 High risk heterosexual behavior: Secondary | ICD-10-CM

## 2018-02-03 DIAGNOSIS — J45990 Exercise induced bronchospasm: Secondary | ICD-10-CM

## 2018-02-03 DIAGNOSIS — Z113 Encounter for screening for infections with a predominantly sexual mode of transmission: Secondary | ICD-10-CM

## 2018-02-03 DIAGNOSIS — F4522 Body dysmorphic disorder: Secondary | ICD-10-CM

## 2018-02-03 NOTE — Patient Instructions (Signed)
Please go to the lab today for blood work.  I will call you with your results. We will alter treatment regimen(s) if indicated by your results.   It was very nice meeting you today. Welcome to [facility]!  I will call you once I have reviewed all of your records.

## 2018-02-03 NOTE — Progress Notes (Signed)
Patient presents to clinic today to establish care.  Acute Concerns: Patient notes that he and his wife "entertain" often. On further discussion of this, patient notes that they are part of a swinger's club. Note they both engage in consensual sex with other partners that they are familiar with. Denies there always being consistent use of condoms. Denies any known history of STI, but states he has never been tested. Denies any current symptoms. Is wondering if he should be checked out. States he is very healthy.   Chronic Issues:  Asthma -- Exercise-induced. Rare symptoms. Has albuterol inhaler that he endorses using maybe 2 x year.   ED -- Currently on a regimen of Sildenafil 20 mg as needed for ED.  Health Maintenance: Immunizations -- TDaP up-to-date. Flu shot up-to-date.. Colonoscopy --  Up-to-date per patient. Will obtain records.   Past Medical History:  Diagnosis Date  . Allergy   . Depression   . GERD (gastroesophageal reflux disease)   . History of chickenpox   . Thyroid disease     Past Surgical History:  Procedure Laterality Date  . FRACTURE SURGERY     Lumbar 2 compression  . KNEE SURGERY    . SHOULDER SURGERY    . SINUS SURGERY WITH INSTATRAK    . TONSILLECTOMY      Current Outpatient Medications on File Prior to Visit  Medication Sig Dispense Refill  . Albuterol Sulfate (PROAIR RESPICLICK) 108 (90 Base) MCG/ACT AEPB Inhale 2 puffs into the lungs every 4 (four) hours as needed. For cough or wheeze.  May use 2 puffs 10-20 minutes prior to exercise. 3 each 1  . Ascorbic Acid (VITAMIN C) 100 MG tablet Take 1 tablet by mouth daily.    . Azelastine HCl 0.15 % SOLN Place 2 sprays into both nostrils 2 (two) times daily. 90 mL 2  . EPIPEN 2-PAK 0.3 MG/0.3ML SOAJ injection USE AS DIRECTED FOR SEVERE ALLERGIC REACTION 1 Device 1  . Fluticasone Propionate (XHANCE) 93 MCG/ACT EXHU Place 2 sprays into the nose 2 (two) times daily. 96 mL 2  . levocetirizine (XYZAL) 5 MG  tablet Take 1 tablet (5 mg total) by mouth daily. 90 tablet 2  . Magnesium Gluconate 550 MG TABS Take 1 tablet by mouth 2 (two) times daily.    . mometasone (ASMANEX) 220 MCG/INH inhaler Inhale 2 puffs into the lungs daily. 3 Inhaler 2  . sildenafil (REVATIO) 20 MG tablet Take 20 mg by mouth 3 (three) times daily.    . Zinc 50 MG TABS Take 1 tablet by mouth daily.     Current Facility-Administered Medications on File Prior to Visit  Medication Dose Route Frequency Provider Last Rate Last Dose  . predniSONE (DELTASONE) tablet 10 mg  10 mg Oral Q breakfast Bobbitt, Heywood Iles, MD        Allergies  Allergen Reactions  . Adhesive [Tape] Rash    Family History  Problem Relation Age of Onset  . Cancer Mother        Metastatic Cancer Secondary  . Depression Mother   . Early death Mother   . Miscarriages / [place] Mother   . Alcohol abuse Father   . COPD Father   . Depression Father   . Drug abuse Father   . Early death Father   . Heart attack Father   . Heart disease Father   . Hyperlipidemia Father   . Hypertension Father   . Intellectual disability Father   . Drug abuse  Son   . Diabetes Maternal Grandmother   . Stroke Maternal Grandfather   . Diabetes Paternal Grandmother   . Alcohol abuse Paternal Grandfather   . Drug abuse Son     Social History   Socioeconomic History  . Marital status: Married    Spouse name: Not on file  . Number of children: 4  . Years of education: Not on file  . Highest education level: Not on file  Occupational History  . Not on file  Social Needs  . Financial resource strain: Not on file  . Food insecurity:    Worry: Not on file    Inability: Not on file  . Transportation needs:    Medical: Not on file    Non-medical: Not on file  Tobacco Use  . Smoking status: Never Smoker  . Smokeless tobacco: Never Used  Substance and Sexual Activity  . Alcohol use: Yes    Alcohol/week: 0.0 standard drinks  . Drug use: Not Currently  .  Sexual activity: Yes  Lifestyle  . Physical activity:    Days per week: Not on file    Minutes per session: Not on file  . Stress: Not on file  Relationships  . Social connections:    Talks on phone: Not on file    Gets together: Not on file    Attends religious service: Not on file    Active member of club or organization: Not on file    Attends meetings of clubs or organizations: Not on file    Relationship status: Not on file  . Intimate partner violence:    Fear of current or ex partner: Not on file    Emotionally abused: Not on file    Physically abused: Not on file    Forced sexual activity: Not on file  Other Topics Concern  . Not on file  Social History Narrative  . Not on file   Review of Systems  Constitutional: Negative for fever and weight loss.  HENT: Negative for ear discharge, ear pain, hearing loss and tinnitus.   Eyes: Negative for blurred vision, double vision, photophobia and pain.  Respiratory: Negative for cough and shortness of breath.   Cardiovascular: Negative for chest pain and palpitations.  Gastrointestinal: Negative for abdominal pain, blood in stool, constipation, diarrhea, heartburn, melena, nausea and vomiting.  Genitourinary: Negative for dysuria, flank pain, frequency, hematuria and urgency.  Musculoskeletal: Negative for falls.  Neurological: Negative for dizziness, loss of consciousness and headaches.  Endo/Heme/Allergies: Negative for environmental allergies.  Psychiatric/Behavioral: Negative for depression, hallucinations, substance abuse and suicidal ideas. The patient is not nervous/anxious and does not have insomnia.     BP 108/78   Pulse 90   Temp 97.7 F (36.5 C) (Oral)   Resp 16   Ht 5\' 10"  (1.778 m)   Wt 183 lb (83 kg)   SpO2 99%   BMI 26.26 kg/m   Physical Exam Vitals signs reviewed.  Constitutional:      Appearance: Normal appearance.  HENT:     Head: Normocephalic and atraumatic.     Right Ear: Tympanic membrane  normal.     Left Ear: Tympanic membrane normal.     Nose: Nose normal.     Mouth/Throat:     Mouth: Mucous membranes are moist.     Pharynx: Oropharynx is clear.  Eyes:     Conjunctiva/sclera: Conjunctivae normal.     Pupils: Pupils are equal, round, and reactive to light.  Neck:  Musculoskeletal: Neck supple.  Cardiovascular:     Rate and Rhythm: Normal rate and regular rhythm.     Heart sounds: Normal heart sounds.  Pulmonary:     Effort: Pulmonary effort is normal.     Breath sounds: Normal breath sounds.  Abdominal:     General: Bowel sounds are normal. There is no distension.     Palpations: Abdomen is soft.     Tenderness: There is no abdominal tenderness.  Neurological:     General: No focal deficit present.     Mental Status: He is alert and oriented to person, place, and time.  Psychiatric:        Mood and Affect: Mood normal.     Assessment/Plan: 1. Screen for STD (sexually transmitted disease) - Acute Hep Panel & Hep B Surface Ab - RPR - HIV Antibody (routine testing w rflx) - Urine cytology ancillary only([facility]) - HSV(herpes smplx)abs-1+2(IgG+IgM)-bld  2. High risk heterosexual behavior Lab panel obtained today. Patient engaging in swinging without consistent condom usage. Denies MSM activity. Has never had STI screen before. Discussed need for responsibility regarding ones sexual health. Discussed mode of transmissions for STIs. Safe sex practices reviewed. Recommended patient abstain until panel is completed. Recommended STI panel every 3 months at least if he is going to continue with current activity.   3. Exercise-induced asthma Stable. Continue albuterol PRN.  4. Erectile dysfunction, unspecified erectile dysfunction type Continue Sildenafil PRN.    Piedad ClimesWilliam Cody Yolani Vo, PA-C

## 2018-02-04 LAB — URINE CYTOLOGY ANCILLARY ONLY
Chlamydia: NEGATIVE
Neisseria Gonorrhea: NEGATIVE
Trichomonas: NEGATIVE

## 2018-02-05 LAB — ACUTE HEP PANEL AND HEP B SURFACE AB
HEP A IGM: NONREACTIVE
HEP B C IGM: NONREACTIVE
HEP B S AG: NONREACTIVE
HEPATITIS C ANTIBODY REFILL$(REFL): NONREACTIVE
SIGNAL TO CUT-OFF: 0.01 (ref <1.00)

## 2018-02-05 LAB — RPR: RPR Ser Ql: NONREACTIVE

## 2018-02-05 LAB — REFLEX TIQ

## 2018-02-05 LAB — HIV ANTIBODY (ROUTINE TESTING W REFLEX): HIV 1&2 Ab, 4th Generation: NONREACTIVE

## 2018-02-05 LAB — HSV(HERPES SMPLX)ABS-I+II(IGG+IGM)-BLD
HSV 1 Glycoprotein G Ab, IgG: <0.91 index (ref 0.00–0.90)
HSV 2 IgG, Type Spec: <0.91 index (ref 0.00–0.90)
HSVI/II Comb IgM: <0.91 Ratio (ref 0.00–0.90)

## 2018-02-07 ENCOUNTER — Encounter: Payer: Self-pay | Admitting: Physician Assistant

## 2018-02-08 ENCOUNTER — Encounter: Payer: Self-pay | Admitting: Physician Assistant

## 2018-02-09 ENCOUNTER — Encounter: Payer: Self-pay | Admitting: Physician Assistant

## 2018-02-09 NOTE — Telephone Encounter (Signed)
Called and spoke with Mr. Lantis regarding his concerns as was unable to reach Ms. Cowens this morning. Discussed need for ER assessment today. States she is more clearheaded this morning. Is resting currently. He is going to take her to the ER.

## 2018-02-10 ENCOUNTER — Encounter: Payer: Self-pay | Admitting: Physician Assistant

## 2018-02-24 ENCOUNTER — Encounter: Payer: Self-pay | Admitting: Emergency Medicine

## 2018-02-25 ENCOUNTER — Ambulatory Visit (INDEPENDENT_AMBULATORY_CARE_PROVIDER_SITE_OTHER): Payer: BC Managed Care – PPO | Admitting: *Deleted

## 2018-02-25 DIAGNOSIS — E119 Type 2 diabetes mellitus without complications: Secondary | ICD-10-CM | POA: Diagnosis not present

## 2018-02-25 DIAGNOSIS — R5381 Other malaise: Secondary | ICD-10-CM | POA: Diagnosis not present

## 2018-02-25 DIAGNOSIS — J309 Allergic rhinitis, unspecified: Secondary | ICD-10-CM

## 2018-02-25 DIAGNOSIS — N400 Enlarged prostate without lower urinary tract symptoms: Secondary | ICD-10-CM | POA: Diagnosis not present

## 2018-03-07 ENCOUNTER — Ambulatory Visit: Payer: Self-pay | Admitting: *Deleted

## 2018-03-14 ENCOUNTER — Ambulatory Visit (INDEPENDENT_AMBULATORY_CARE_PROVIDER_SITE_OTHER): Payer: BC Managed Care – PPO

## 2018-03-14 DIAGNOSIS — J309 Allergic rhinitis, unspecified: Secondary | ICD-10-CM | POA: Diagnosis not present

## 2018-03-24 ENCOUNTER — Ambulatory Visit: Payer: PRIVATE HEALTH INSURANCE | Admitting: Allergy and Immunology

## 2018-03-25 ENCOUNTER — Encounter: Payer: Self-pay | Admitting: Physician Assistant

## 2018-03-28 ENCOUNTER — Ambulatory Visit (INDEPENDENT_AMBULATORY_CARE_PROVIDER_SITE_OTHER): Payer: BC Managed Care – PPO | Admitting: *Deleted

## 2018-03-28 DIAGNOSIS — J309 Allergic rhinitis, unspecified: Secondary | ICD-10-CM

## 2018-04-15 ENCOUNTER — Other Ambulatory Visit: Payer: Self-pay

## 2018-04-15 ENCOUNTER — Ambulatory Visit (INDEPENDENT_AMBULATORY_CARE_PROVIDER_SITE_OTHER): Payer: BC Managed Care – PPO | Admitting: *Deleted

## 2018-04-15 DIAGNOSIS — J309 Allergic rhinitis, unspecified: Secondary | ICD-10-CM | POA: Diagnosis not present

## 2018-04-15 DIAGNOSIS — J3089 Other allergic rhinitis: Secondary | ICD-10-CM

## 2018-04-15 MED ORDER — LEVOCETIRIZINE DIHYDROCHLORIDE 5 MG PO TABS: 5.0000 mg | ORAL_TABLET | Freq: Every day | ORAL | 2 refills | Status: DC

## 2018-04-15 MED ORDER — FLUTICASONE PROPIONATE 93 MCG/ACT NA EXHU: 2.0000 | INHALANT_SUSPENSION | Freq: Two times a day (BID) | NASAL | 2 refills | Status: DC

## 2018-04-29 ENCOUNTER — Ambulatory Visit (INDEPENDENT_AMBULATORY_CARE_PROVIDER_SITE_OTHER): Payer: BC Managed Care – PPO | Admitting: *Deleted

## 2018-04-29 DIAGNOSIS — J3089 Other allergic rhinitis: Secondary | ICD-10-CM

## 2018-04-29 MED ORDER — AZELASTINE HCL 0.15 % NA SOLN: 2.0000 | Freq: Two times a day (BID) | NASAL | 2 refills | Status: DC

## 2018-04-29 MED ORDER — FLUTICASONE PROPIONATE 50 MCG/ACT NA SUSP: 2.0000 | Freq: Every day | NASAL | 1 refills | Status: DC

## 2018-05-01 ENCOUNTER — Encounter: Payer: Self-pay | Admitting: Physician Assistant

## 2018-05-02 DIAGNOSIS — E291 Testicular hypofunction: Secondary | ICD-10-CM | POA: Diagnosis not present

## 2018-05-08 ENCOUNTER — Encounter: Payer: Self-pay | Admitting: Physician Assistant

## 2018-05-09 ENCOUNTER — Encounter: Payer: Self-pay | Admitting: Physician Assistant

## 2018-05-09 ENCOUNTER — Other Ambulatory Visit: Payer: Self-pay

## 2018-05-09 ENCOUNTER — Ambulatory Visit (INDEPENDENT_AMBULATORY_CARE_PROVIDER_SITE_OTHER): Payer: BLUE CROSS/BLUE SHIELD | Admitting: Physician Assistant

## 2018-05-09 VITALS — BP 102/78 | HR 55 | Temp 98.1°F | Resp 16 | Ht 70.0 in | Wt 183.0 lb

## 2018-05-09 DIAGNOSIS — N6001 Solitary cyst of right breast: Secondary | ICD-10-CM | POA: Diagnosis not present

## 2018-05-09 NOTE — Progress Notes (Signed)
Patient presents to clinic today c/o lump in his R peck noted over the pat couple of days. Is non-tender. Notes some questionable redness overlying the area but notes he has been pushing on the area. Denies any drainage. Denies fever, chills, malaise or fatigue. Does note history of similar area of left chest requiring mammogram and [place]. Was benign cyst. Has family history of metastatic breast cancer (mother) so he is always concerned about changes in his chest. .   Past Medical History:  Diagnosis Date  . Allergy   . Depression   . GERD (gastroesophageal reflux disease)   . History of chickenpox   . Thyroid disease     Current Outpatient Medications on File Prior to Visit  Medication Sig Dispense Refill  . Albuterol Sulfate (PROAIR RESPICLICK) 108 (90 Base) MCG/ACT AEPB Inhale 2 puffs into the lungs every 4 (four) hours as needed. For cough or wheeze.  May use 2 puffs 10-20 minutes prior to exercise. 3 each 1  . anastrozole (ARIMIDEX) 1 MG tablet Take 1 mg by mouth 2 (two) times a week.    . Ascorbic Acid (VITAMIN C) 100 MG tablet Take 1 tablet by mouth daily.    . Azelastine HCl 0.15 % SOLN Place 2 sprays into both nostrils 2 (two) times daily. 90 mL 2  . B-D 3CC LUER-LOK SYR 20GX1" 20G X 1" 3 ML MISC USE TO INJECT ONCE WEEKLY    . clomiPHENE (CLOMID) 50 MG tablet Take 25 mg by mouth daily.     Marland Kitchen EPIPEN 2-PAK 0.3 MG/0.3ML SOAJ injection USE AS DIRECTED FOR SEVERE ALLERGIC REACTION 1 Device 1  . fluticasone (FLONASE) 50 MCG/ACT nasal spray Place 2 sprays into both nostrils daily. 48 g 1  . Fluticasone Propionate (XHANCE) 93 MCG/ACT EXHU Place 2 sprays into the nose 2 (two) times daily. 96 mL 2  . levocetirizine (XYZAL) 5 MG tablet Take 1 tablet (5 mg total) by mouth daily. 90 tablet 2  . Magnesium Gluconate 550 MG TABS Take 1 tablet by mouth 2 (two) times daily.    . mometasone (ASMANEX) 220 MCG/INH inhaler Inhale 2 puffs into the lungs daily. 3 Inhaler 2  . sildenafil (REVATIO) 20 MG  tablet Take 20 mg by mouth 3 (three) times daily.    Marland Kitchen testosterone cypionate (DEPOTESTOSTERONE CYPIONATE) 200 MG/ML injection 2.3 ml IM every other week    . Zinc 50 MG TABS Take 1 tablet by mouth daily.     Current Facility-Administered Medications on File Prior to Visit  Medication Dose Route Frequency Provider Last Rate Last Dose  . predniSONE (DELTASONE) tablet 10 mg  10 mg Oral Q breakfast Bobbitt, Heywood Iles, MD        Allergies  Allergen Reactions  . Adhesive [Tape] Rash    Family History  Problem Relation Age of Onset  . Cancer Mother        Metastatic Cancer Secondary  . Depression Mother   . Early death Mother   . Miscarriages / [place] Mother   . Alcohol abuse Father   . COPD Father   . Depression Father   . Drug abuse Father   . Early death Father   . Heart attack Father   . Heart disease Father   . Hyperlipidemia Father   . Hypertension Father   . Intellectual disability Father   . Drug abuse Son   . Diabetes Maternal Grandmother   . Stroke Maternal Grandfather   . Diabetes Paternal Grandmother   .  Alcohol abuse Paternal Grandfather   . Drug abuse Son     Social History   Socioeconomic History  . Marital status: Married    Spouse name: Not on file  . Number of children: 4  . Years of education: Not on file  . Highest education level: Not on file  Occupational History  . Not on file  Social Needs  . Financial resource strain: Not on file  . Food insecurity:    Worry: Not on file    Inability: Not on file  . Transportation needs:    Medical: Not on file    Non-medical: Not on file  Tobacco Use  . Smoking status: Never Smoker  . Smokeless tobacco: Never Used  Substance and Sexual Activity  . Alcohol use: Yes    Alcohol/week: 0.0 standard drinks  . Drug use: Not Currently  . Sexual activity: Yes  Lifestyle  . Physical activity:    Days per week: Not on file    Minutes per session: Not on file  . Stress: Not on file  Relationships   . Social connections:    Talks on phone: Not on file    Gets together: Not on file    Attends religious service: Not on file    Active member of club or organization: Not on file    Attends meetings of clubs or organizations: Not on file    Relationship status: Not on file  Other Topics Concern  . Not on file  Social History Narrative  . Not on file   Review of Systems - See HPI.  All other ROS are negative.  BP 102/78   Pulse (!) 55   Temp 98.1 F (36.7 C) (Oral)   Resp 16   Ht 5\' 10"  (1.778 m)   Wt 183 lb (83 kg)   SpO2 98%   BMI 26.26 kg/m   Physical Exam Vitals signs reviewed.  Constitutional:      Appearance: Normal appearance.  HENT:     Head: Normocephalic and atraumatic.     Nose: Nose normal.     Mouth/Throat:     Mouth: Mucous membranes are moist.  Neck:     Musculoskeletal: Neck supple.  Cardiovascular:     Rate and Rhythm: Normal rate and regular rhythm.     Pulses: Normal pulses.     Heart sounds: Normal heart sounds.  Pulmonary:     Effort: Pulmonary effort is normal.  Chest:    Neurological:     General: No focal deficit present.     Mental Status: He is alert and oriented to person, place, and time.  Psychiatric:        Mood and Affect: Mood normal.      Assessment/Plan: 1. Cyst of breast, right, benign solitary Suspect mild SQ cyst but giving patient's history (personal and family) and current concerns will proceed with imaging to further characterize area. Reviewed signs/symptoms of infected cyst. He is to call if he notes this or any new or worsening symptom. - US BREAST LTD UNI RIGHT INC AXILLA; Future  Bilateral screening mammogram ordered as well to follow-up of prior lesion of left breast.    Piedad ClimesWilliam Cody Janine Reller, PA-C

## 2018-05-09 NOTE — Patient Instructions (Signed)
Please avoid touching the area. Apply warm compresses to the area to see if it softens the lump. This seems most like a benign inflamed subcutaneous cyst but giving history (family and personal) we are getting imaging to further assess. You will be contacted for scheduling the imaging.  If you note any increased redness or tenderness to the area or surrounding skin over the weekend, let me know and we will start antibiotic.

## 2018-05-12 ENCOUNTER — Other Ambulatory Visit: Payer: Self-pay | Admitting: Physician Assistant

## 2018-05-12 DIAGNOSIS — N6001 Solitary cyst of right breast: Secondary | ICD-10-CM

## 2018-05-15 ENCOUNTER — Other Ambulatory Visit: Payer: Self-pay | Admitting: Physician Assistant

## 2018-05-15 ENCOUNTER — Ambulatory Visit
Admission: RE | Admit: 2018-05-15 | Discharge: 2018-05-15 | Disposition: A | Discharge status: Home / Self Care | Payer: BLUE CROSS/BLUE SHIELD | Source: Ambulatory Visit | Attending: Physician Assistant | Admitting: Physician Assistant

## 2018-05-15 ENCOUNTER — Other Ambulatory Visit: Payer: Self-pay

## 2018-05-15 DIAGNOSIS — N6310 Unspecified lump in the right breast, unspecified quadrant: Secondary | ICD-10-CM

## 2018-05-15 DIAGNOSIS — N6001 Solitary cyst of right breast: Secondary | ICD-10-CM

## 2018-05-22 ENCOUNTER — Ambulatory Visit (INDEPENDENT_AMBULATORY_CARE_PROVIDER_SITE_OTHER): Payer: BC Managed Care – PPO | Admitting: *Deleted

## 2018-05-22 DIAGNOSIS — J309 Allergic rhinitis, unspecified: Secondary | ICD-10-CM

## 2018-05-23 ENCOUNTER — Other Ambulatory Visit: Payer: Self-pay | Admitting: Physician Assistant

## 2018-05-23 ENCOUNTER — Other Ambulatory Visit: Payer: Self-pay

## 2018-05-23 ENCOUNTER — Ambulatory Visit
Admission: RE | Admit: 2018-05-23 | Discharge: 2018-05-23 | Disposition: A | Discharge status: Home / Self Care | Payer: BLUE CROSS/BLUE SHIELD | Source: Ambulatory Visit | Attending: Physician Assistant | Admitting: Physician Assistant

## 2018-05-23 DIAGNOSIS — N6310 Unspecified lump in the right breast, unspecified quadrant: Secondary | ICD-10-CM | POA: Diagnosis not present

## 2018-05-23 DIAGNOSIS — N641 Fat necrosis of breast: Secondary | ICD-10-CM | POA: Diagnosis not present

## 2018-05-23 DIAGNOSIS — N6311 Unspecified lump in the right breast, upper outer quadrant: Secondary | ICD-10-CM | POA: Diagnosis not present

## 2018-05-27 DIAGNOSIS — K760 Fatty (change of) liver, not elsewhere classified: Secondary | ICD-10-CM | POA: Diagnosis not present

## 2018-05-27 DIAGNOSIS — N400 Enlarged prostate without lower urinary tract symptoms: Secondary | ICD-10-CM | POA: Diagnosis not present

## 2018-05-27 DIAGNOSIS — R5383 Other fatigue: Secondary | ICD-10-CM | POA: Diagnosis not present

## 2018-05-27 DIAGNOSIS — E291 Testicular hypofunction: Secondary | ICD-10-CM | POA: Diagnosis not present

## 2018-05-27 DIAGNOSIS — R7309 Other abnormal glucose: Secondary | ICD-10-CM | POA: Diagnosis not present

## 2018-06-05 DIAGNOSIS — Z2089 Contact with and (suspected) exposure to other communicable diseases: Secondary | ICD-10-CM | POA: Diagnosis not present

## 2018-06-09 ENCOUNTER — Ambulatory Visit: Payer: PRIVATE HEALTH INSURANCE | Admitting: Allergy and Immunology

## 2018-06-19 ENCOUNTER — Ambulatory Visit (INDEPENDENT_AMBULATORY_CARE_PROVIDER_SITE_OTHER): Payer: BC Managed Care – PPO

## 2018-06-19 DIAGNOSIS — M9903 Segmental and somatic dysfunction of lumbar region: Secondary | ICD-10-CM | POA: Diagnosis not present

## 2018-06-19 DIAGNOSIS — M9901 Segmental and somatic dysfunction of cervical region: Secondary | ICD-10-CM | POA: Diagnosis not present

## 2018-06-19 DIAGNOSIS — J309 Allergic rhinitis, unspecified: Secondary | ICD-10-CM | POA: Diagnosis not present

## 2018-06-19 DIAGNOSIS — M9902 Segmental and somatic dysfunction of thoracic region: Secondary | ICD-10-CM | POA: Diagnosis not present

## 2018-06-19 DIAGNOSIS — M791 Myalgia, unspecified site: Secondary | ICD-10-CM | POA: Diagnosis not present

## 2018-07-08 DIAGNOSIS — M9903 Segmental and somatic dysfunction of lumbar region: Secondary | ICD-10-CM | POA: Diagnosis not present

## 2018-07-08 DIAGNOSIS — M9901 Segmental and somatic dysfunction of cervical region: Secondary | ICD-10-CM | POA: Diagnosis not present

## 2018-07-08 DIAGNOSIS — M9902 Segmental and somatic dysfunction of thoracic region: Secondary | ICD-10-CM | POA: Diagnosis not present

## 2018-07-08 DIAGNOSIS — M791 Myalgia, unspecified site: Secondary | ICD-10-CM | POA: Diagnosis not present

## 2018-07-17 ENCOUNTER — Ambulatory Visit (INDEPENDENT_AMBULATORY_CARE_PROVIDER_SITE_OTHER): Payer: BC Managed Care – PPO | Admitting: *Deleted

## 2018-07-17 DIAGNOSIS — J309 Allergic rhinitis, unspecified: Secondary | ICD-10-CM

## 2018-07-17 DIAGNOSIS — M9902 Segmental and somatic dysfunction of thoracic region: Secondary | ICD-10-CM | POA: Diagnosis not present

## 2018-07-17 DIAGNOSIS — M791 Myalgia, unspecified site: Secondary | ICD-10-CM | POA: Diagnosis not present

## 2018-07-17 DIAGNOSIS — M9903 Segmental and somatic dysfunction of lumbar region: Secondary | ICD-10-CM | POA: Diagnosis not present

## 2018-07-17 DIAGNOSIS — M9901 Segmental and somatic dysfunction of cervical region: Secondary | ICD-10-CM | POA: Diagnosis not present

## 2018-07-22 ENCOUNTER — Encounter: Payer: Self-pay | Admitting: Physician Assistant

## 2018-07-23 DIAGNOSIS — J3089 Other allergic rhinitis: Secondary | ICD-10-CM | POA: Diagnosis not present

## 2018-07-23 NOTE — Progress Notes (Signed)
VIALS EXP 07-2019

## 2018-08-04 ENCOUNTER — Ambulatory Visit: Payer: PRIVATE HEALTH INSURANCE | Admitting: Allergy and Immunology

## 2018-08-20 DIAGNOSIS — E291 Testicular hypofunction: Secondary | ICD-10-CM | POA: Diagnosis not present

## 2018-08-20 DIAGNOSIS — R5381 Other malaise: Secondary | ICD-10-CM | POA: Diagnosis not present

## 2018-08-26 ENCOUNTER — Ambulatory Visit (INDEPENDENT_AMBULATORY_CARE_PROVIDER_SITE_OTHER): Payer: BC Managed Care – PPO | Admitting: *Deleted

## 2018-08-26 DIAGNOSIS — J309 Allergic rhinitis, unspecified: Secondary | ICD-10-CM

## 2018-08-28 DIAGNOSIS — Z23 Encounter for immunization: Secondary | ICD-10-CM | POA: Diagnosis not present

## 2018-09-01 ENCOUNTER — Ambulatory Visit: Payer: PRIVATE HEALTH INSURANCE | Admitting: Allergy and Immunology

## 2018-09-23 ENCOUNTER — Ambulatory Visit (INDEPENDENT_AMBULATORY_CARE_PROVIDER_SITE_OTHER): Payer: BC Managed Care – PPO | Admitting: Allergy and Immunology

## 2018-09-23 ENCOUNTER — Other Ambulatory Visit: Payer: Self-pay

## 2018-09-23 ENCOUNTER — Encounter: Payer: Self-pay | Admitting: Allergy and Immunology

## 2018-09-23 ENCOUNTER — Ambulatory Visit: Payer: Self-pay | Admitting: *Deleted

## 2018-09-23 DIAGNOSIS — J3089 Other allergic rhinitis: Secondary | ICD-10-CM

## 2018-09-23 DIAGNOSIS — J4520 Mild intermittent asthma, uncomplicated: Secondary | ICD-10-CM | POA: Diagnosis not present

## 2018-09-23 DIAGNOSIS — J309 Allergic rhinitis, unspecified: Secondary | ICD-10-CM

## 2018-09-23 MED ORDER — FEXOFENADINE HCL 180 MG PO TABS: 180.0000 mg | ORAL_TABLET | Freq: Every day | ORAL | 5 refills | Status: DC

## 2018-09-23 MED ORDER — XHANCE 93 MCG/ACT NA EXHU: 2.0000 | INHALANT_SUSPENSION | Freq: Two times a day (BID) | NASAL | 2 refills | Status: DC

## 2018-09-23 NOTE — Progress Notes (Signed)
Follow-up Note  RE: Jeremy [name] MRN: [id number] DOB: 07/13/1960 Date of Office Visit: 09/23/2018  Primary care provider: Waldon Merl, PA-C Referring provider: Waldon Merl, PA-C  History of present illness: Jeremy [name] is a 58 y.o. male with allergic rhinoconjunctivitis and mild intermittent asthma presenting today for follow-up.  He was last seen in this clinic in December 2018.  He has been receiving aero allergen immunotherapy injections at maintenance dose without problems or complications.  He reports that his nasal allergy symptoms are currently well controlled.  He uses Xhance nasal spray, azelastine nasal spray, and levocetirizine.  He has been taking levocetirizine daily, due to routine.  He requests prescription refills. Interval since his previous visit he has not required asthma rescue medication, experienced nocturnal awakenings due to lower respiratory symptoms, nor have activities of daily living been limited.   Assessment and plan: Other allergic rhinitis Stable.  Continue appropriate allergen avoidance measures, aeroallergen immunotherapy injections as prescribed, levocetirizine as needed, Xhance, 1 spray per nostril twice daily as needed, and azelastine nasal spray, 1-2 sprays per nostril twice daily as needed.  To avoid diminishing benefit with daily use (tachyphylaxis) of second generation antihistamine, consider alternating every few months between fexofenadine (Allegra) and levocetirizine (Xyzal).  Nasal saline spray (i.e., Simply Saline) or nasal saline lavage (i.e., NeilMed) is recommended as needed and prior to medicated nasal sprays.  Refill prescriptions have been provided for levocetirizine, so Xhance, and azelastine nasal spray.  Mild intermittent asthma/exercise-induced bronchospasm Well-controlled.  Continue ProAir Respiclick every 4-6 hours as needed and 15 minutes prior to exercise.  Subjective and objective measures of pulmonary  function will be followed and the treatment plan will be adjusted accordingly.   Meds ordered this encounter  Medications  . fexofenadine (ALLEGRA) 180 MG tablet    Sig: Take 1 tablet (180 mg total) by mouth daily. Alternate every few months with Xyzal.    Dispense:  30 tablet    Refill:  5  . Fluticasone Propionate (XHANCE) 93 MCG/ACT EXHU    Sig: Place 2 sprays into the nose 2 (two) times daily.    Dispense:  96 mL    Refill:  2     Physical examination: Blood pressure 118/70, pulse 69, temperature (!) 97.5 F (36.4 C), temperature source Temporal, resp. rate 17, height 5\' 11"  (1.803 m), weight 188 lb (85.3 kg), SpO2 98 %.  General: Alert, interactive, in no acute distress. HEENT: TMs pearly gray, turbinates minimally edematous without discharge, post-pharynx unremarkable. Neck: Supple without lymphadenopathy. Lungs: Clear to auscultation without wheezing, rhonchi or rales. CV: Normal S1, S2 without murmurs. Skin: Warm and dry, without lesions or rashes.  The following portions of the patient's history were reviewed and updated as appropriate: allergies, current medications, past family history, past medical history, past social history, past surgical history and problem list.  Allergies as of 09/23/2018      Reactions   Adhesive [tape] Rash      Medication List       Accurate as of September 23, 2018  7:59 PM. If you have any questions, ask your nurse or doctor.        Albuterol Sulfate 108 (90 Base) MCG/ACT Aepb Commonly known as: ProAir RespiClick Inhale 2 puffs into the lungs every 4 (four) hours as needed. For cough or wheeze.  May use 2 puffs 10-20 minutes prior to exercise.   anastrozole 1 MG tablet Commonly known as: ARIMIDEX Take 1 mg by mouth 2 (  two) times a week.   Azelastine HCl 0.15 % Soln Place 2 sprays into both nostrils 2 (two) times daily.   B-D 3CC LUER-LOK SYR 20GX1" 20G X 1" 3 ML Misc Generic drug: SYRINGE-NEEDLE (DISP) 3 ML USE TO INJECT  ONCE WEEKLY   clomiPHENE 50 MG tablet Commonly known as: CLOMID Take 25 mg by mouth daily.   EpiPen 2-Pak 0.3 mg/0.3 mL Soaj injection Generic drug: EPINEPHrine USE AS DIRECTED FOR SEVERE ALLERGIC REACTION   fexofenadine 180 MG tablet Commonly known as: ALLEGRA Take 1 tablet (180 mg total) by mouth daily. Alternate every few months with Xyzal. Started by: Edmonia Lynch, MD   fluticasone 50 MCG/ACT nasal spray Commonly known as: FLONASE Place 2 sprays into both nostrils daily.   Xhance 93 MCG/ACT Exhu Generic drug: Fluticasone Propionate Place 2 sprays into the nose 2 (two) times daily.   levocetirizine 5 MG tablet Commonly known as: Xyzal Take 1 tablet (5 mg total) by mouth daily.   Magnesium Gluconate 550 MG Tabs Take 1 tablet by mouth 2 (two) times daily.   mometasone 220 MCG/INH inhaler Commonly known as: ASMANEX Inhale 2 puffs into the lungs daily.   sildenafil 20 MG tablet Commonly known as: REVATIO Take 20 mg by mouth 3 (three) times daily.   testosterone cypionate 200 MG/ML injection Commonly known as: DEPOTESTOSTERONE CYPIONATE 2.3 ml IM every other week   vitamin C 100 MG tablet Take 1 tablet by mouth daily.   Zinc 50 MG Tabs Take 1 tablet by mouth daily.       Allergies  Allergen Reactions  . Adhesive [Tape] Rash    I appreciate the opportunity to take part in [name]'s care. Please do not hesitate to contact me with questions.  Sincerely,   R. Edgar Frisk, MD

## 2018-09-23 NOTE — Assessment & Plan Note (Signed)
Well-controlled.  Continue ProAir Respiclick every 4-6 hours as needed and 15 minutes prior to exercise.  Subjective and objective measures of pulmonary function will be followed and the treatment plan will be adjusted accordingly.

## 2018-09-23 NOTE — Assessment & Plan Note (Signed)
Stable.  Continue appropriate allergen avoidance measures, aeroallergen immunotherapy injections as prescribed, levocetirizine as needed, Xhance, 1 spray per nostril twice daily as needed, and azelastine nasal spray, 1-2 sprays per nostril twice daily as needed.  To avoid diminishing benefit with daily use (tachyphylaxis) of second generation antihistamine, consider alternating every few months between fexofenadine (Allegra) and levocetirizine (Xyzal).  Nasal saline spray (i.e., Simply Saline) or nasal saline lavage (i.e., NeilMed) is recommended as needed and prior to medicated nasal sprays.  Refill prescriptions have been provided for levocetirizine, so Xhance, and azelastine nasal spray.

## 2018-09-23 NOTE — Patient Instructions (Addendum)
Other allergic rhinitis Stable.  Continue appropriate allergen avoidance measures, aeroallergen immunotherapy injections as prescribed, levocetirizine as needed, Xhance, 1 spray per nostril twice daily as needed, and azelastine nasal spray, 1-2 sprays per nostril twice daily as needed.  To avoid diminishing benefit with daily use (tachyphylaxis) of second generation antihistamine, consider alternating every few months between fexofenadine (Allegra) and levocetirizine (Xyzal).  Nasal saline spray (i.e., Simply Saline) or nasal saline lavage (i.e., NeilMed) is recommended as needed and prior to medicated nasal sprays.  Refill prescriptions have been provided for levocetirizine, so Xhance, and azelastine nasal spray.  Mild intermittent asthma/exercise-induced bronchospasm Well-controlled.  Continue ProAir Respiclick every 4-6 hours as needed and 15 minutes prior to exercise.  Subjective and objective measures of pulmonary function will be followed and the treatment plan will be adjusted accordingly.   Return in about 1 year (around 09/23/2019), or if symptoms worsen or fail to improve.

## 2018-10-19 DIAGNOSIS — Z23 Encounter for immunization: Secondary | ICD-10-CM | POA: Diagnosis not present

## 2018-10-23 ENCOUNTER — Ambulatory Visit (INDEPENDENT_AMBULATORY_CARE_PROVIDER_SITE_OTHER): Payer: BC Managed Care – PPO | Admitting: *Deleted

## 2018-10-23 DIAGNOSIS — J309 Allergic rhinitis, unspecified: Secondary | ICD-10-CM

## 2018-10-23 DIAGNOSIS — M791 Myalgia, unspecified site: Secondary | ICD-10-CM | POA: Diagnosis not present

## 2018-10-23 DIAGNOSIS — M9903 Segmental and somatic dysfunction of lumbar region: Secondary | ICD-10-CM | POA: Diagnosis not present

## 2018-10-23 DIAGNOSIS — M9902 Segmental and somatic dysfunction of thoracic region: Secondary | ICD-10-CM | POA: Diagnosis not present

## 2018-10-23 DIAGNOSIS — M9901 Segmental and somatic dysfunction of cervical region: Secondary | ICD-10-CM | POA: Diagnosis not present

## 2018-10-27 ENCOUNTER — Telehealth: Payer: Self-pay | Admitting: *Deleted

## 2018-10-27 NOTE — Telephone Encounter (Signed)
Noted! Thank you

## 2018-10-27 NOTE — Telephone Encounter (Signed)
Patient would like to move his allergen vials to the [facility] office to continue his allergy injections since it is closer for him. His vials have been placed in the [facility] basket to be moved to the OR office. Patient did state that he has reached his out of pocket maximum this year and would like to order a new set of red vials before the end of the year. Spoke with [place] and she stated that mid December would be the best time to order a new set to ensure that [facility] are able to make and bill the vials before the end of the year and before the holidays. A sticker has been placed on each of his vials stating that he would like vials re-ordered before the end of the year.

## 2018-10-30 DIAGNOSIS — M9902 Segmental and somatic dysfunction of thoracic region: Secondary | ICD-10-CM | POA: Diagnosis not present

## 2018-10-30 DIAGNOSIS — M9901 Segmental and somatic dysfunction of cervical region: Secondary | ICD-10-CM | POA: Diagnosis not present

## 2018-10-30 DIAGNOSIS — M9903 Segmental and somatic dysfunction of lumbar region: Secondary | ICD-10-CM | POA: Diagnosis not present

## 2018-10-30 DIAGNOSIS — M791 Myalgia, unspecified site: Secondary | ICD-10-CM | POA: Diagnosis not present

## 2018-11-03 DIAGNOSIS — M9902 Segmental and somatic dysfunction of thoracic region: Secondary | ICD-10-CM | POA: Diagnosis not present

## 2018-11-03 DIAGNOSIS — M9901 Segmental and somatic dysfunction of cervical region: Secondary | ICD-10-CM | POA: Diagnosis not present

## 2018-11-03 DIAGNOSIS — M9903 Segmental and somatic dysfunction of lumbar region: Secondary | ICD-10-CM | POA: Diagnosis not present

## 2018-11-03 DIAGNOSIS — M791 Myalgia, unspecified site: Secondary | ICD-10-CM | POA: Diagnosis not present

## 2018-11-06 DIAGNOSIS — M9901 Segmental and somatic dysfunction of cervical region: Secondary | ICD-10-CM | POA: Diagnosis not present

## 2018-11-06 DIAGNOSIS — M9902 Segmental and somatic dysfunction of thoracic region: Secondary | ICD-10-CM | POA: Diagnosis not present

## 2018-11-06 DIAGNOSIS — M9903 Segmental and somatic dysfunction of lumbar region: Secondary | ICD-10-CM | POA: Diagnosis not present

## 2018-11-06 DIAGNOSIS — M791 Myalgia, unspecified site: Secondary | ICD-10-CM | POA: Diagnosis not present

## 2018-11-11 DIAGNOSIS — M9903 Segmental and somatic dysfunction of lumbar region: Secondary | ICD-10-CM | POA: Diagnosis not present

## 2018-11-11 DIAGNOSIS — M791 Myalgia, unspecified site: Secondary | ICD-10-CM | POA: Diagnosis not present

## 2018-11-11 DIAGNOSIS — M9901 Segmental and somatic dysfunction of cervical region: Secondary | ICD-10-CM | POA: Diagnosis not present

## 2018-11-11 DIAGNOSIS — M9902 Segmental and somatic dysfunction of thoracic region: Secondary | ICD-10-CM | POA: Diagnosis not present

## 2018-11-13 DIAGNOSIS — M9903 Segmental and somatic dysfunction of lumbar region: Secondary | ICD-10-CM | POA: Diagnosis not present

## 2018-11-13 DIAGNOSIS — M9901 Segmental and somatic dysfunction of cervical region: Secondary | ICD-10-CM | POA: Diagnosis not present

## 2018-11-13 DIAGNOSIS — M9902 Segmental and somatic dysfunction of thoracic region: Secondary | ICD-10-CM | POA: Diagnosis not present

## 2018-11-13 DIAGNOSIS — M791 Myalgia, unspecified site: Secondary | ICD-10-CM | POA: Diagnosis not present

## 2018-11-18 DIAGNOSIS — M9902 Segmental and somatic dysfunction of thoracic region: Secondary | ICD-10-CM | POA: Diagnosis not present

## 2018-11-18 DIAGNOSIS — M9903 Segmental and somatic dysfunction of lumbar region: Secondary | ICD-10-CM | POA: Diagnosis not present

## 2018-11-18 DIAGNOSIS — M9901 Segmental and somatic dysfunction of cervical region: Secondary | ICD-10-CM | POA: Diagnosis not present

## 2018-11-18 DIAGNOSIS — M791 Myalgia, unspecified site: Secondary | ICD-10-CM | POA: Diagnosis not present

## 2018-11-21 DIAGNOSIS — E291 Testicular hypofunction: Secondary | ICD-10-CM | POA: Diagnosis not present

## 2018-11-21 DIAGNOSIS — R5381 Other malaise: Secondary | ICD-10-CM | POA: Diagnosis not present

## 2018-11-21 DIAGNOSIS — E039 Hypothyroidism, unspecified: Secondary | ICD-10-CM | POA: Diagnosis not present

## 2018-11-21 DIAGNOSIS — N400 Enlarged prostate without lower urinary tract symptoms: Secondary | ICD-10-CM | POA: Diagnosis not present

## 2018-11-24 DIAGNOSIS — M9903 Segmental and somatic dysfunction of lumbar region: Secondary | ICD-10-CM | POA: Diagnosis not present

## 2018-11-24 DIAGNOSIS — M9901 Segmental and somatic dysfunction of cervical region: Secondary | ICD-10-CM | POA: Diagnosis not present

## 2018-11-24 DIAGNOSIS — M9902 Segmental and somatic dysfunction of thoracic region: Secondary | ICD-10-CM | POA: Diagnosis not present

## 2018-11-24 DIAGNOSIS — M791 Myalgia, unspecified site: Secondary | ICD-10-CM | POA: Diagnosis not present

## 2018-11-25 ENCOUNTER — Ambulatory Visit (INDEPENDENT_AMBULATORY_CARE_PROVIDER_SITE_OTHER): Payer: BC Managed Care – PPO | Admitting: *Deleted

## 2018-11-25 ENCOUNTER — Other Ambulatory Visit: Payer: Self-pay

## 2018-11-25 DIAGNOSIS — J309 Allergic rhinitis, unspecified: Secondary | ICD-10-CM

## 2018-11-25 MED ORDER — AZELASTINE HCL 0.15 % NA SOLN: 2.0000 | Freq: Two times a day (BID) | NASAL | 0 refills | Status: DC

## 2018-12-03 DIAGNOSIS — M791 Myalgia, unspecified site: Secondary | ICD-10-CM | POA: Diagnosis not present

## 2018-12-03 DIAGNOSIS — M9901 Segmental and somatic dysfunction of cervical region: Secondary | ICD-10-CM | POA: Diagnosis not present

## 2018-12-03 DIAGNOSIS — J309 Allergic rhinitis, unspecified: Secondary | ICD-10-CM | POA: Diagnosis not present

## 2018-12-03 DIAGNOSIS — R5383 Other fatigue: Secondary | ICD-10-CM | POA: Diagnosis not present

## 2018-12-03 DIAGNOSIS — M9903 Segmental and somatic dysfunction of lumbar region: Secondary | ICD-10-CM | POA: Diagnosis not present

## 2018-12-03 DIAGNOSIS — M9902 Segmental and somatic dysfunction of thoracic region: Secondary | ICD-10-CM | POA: Diagnosis not present

## 2018-12-03 DIAGNOSIS — J069 Acute upper respiratory infection, unspecified: Secondary | ICD-10-CM | POA: Diagnosis not present

## 2018-12-03 DIAGNOSIS — N400 Enlarged prostate without lower urinary tract symptoms: Secondary | ICD-10-CM | POA: Diagnosis not present

## 2018-12-04 ENCOUNTER — Encounter: Payer: Self-pay | Admitting: Physician Assistant

## 2018-12-05 ENCOUNTER — Other Ambulatory Visit: Payer: Self-pay

## 2018-12-05 DIAGNOSIS — Z20822 Contact with and (suspected) exposure to covid-19: Secondary | ICD-10-CM

## 2018-12-07 LAB — NOVEL CORONAVIRUS, NAA: SARS-CoV-2, NAA: NOT DETECTED

## 2018-12-10 DIAGNOSIS — M9903 Segmental and somatic dysfunction of lumbar region: Secondary | ICD-10-CM | POA: Diagnosis not present

## 2018-12-10 DIAGNOSIS — M9901 Segmental and somatic dysfunction of cervical region: Secondary | ICD-10-CM | POA: Diagnosis not present

## 2018-12-10 DIAGNOSIS — M9902 Segmental and somatic dysfunction of thoracic region: Secondary | ICD-10-CM | POA: Diagnosis not present

## 2018-12-10 DIAGNOSIS — M791 Myalgia, unspecified site: Secondary | ICD-10-CM | POA: Diagnosis not present

## 2018-12-17 DIAGNOSIS — M25512 Pain in left shoulder: Secondary | ICD-10-CM | POA: Diagnosis not present

## 2018-12-17 DIAGNOSIS — M25561 Pain in right knee: Secondary | ICD-10-CM | POA: Diagnosis not present

## 2018-12-18 ENCOUNTER — Other Ambulatory Visit: Payer: Self-pay | Admitting: Orthopaedic Surgery

## 2018-12-18 DIAGNOSIS — M25512 Pain in left shoulder: Secondary | ICD-10-CM

## 2018-12-19 DIAGNOSIS — D225 Melanocytic nevi of trunk: Secondary | ICD-10-CM | POA: Diagnosis not present

## 2018-12-19 DIAGNOSIS — L821 Other seborrheic keratosis: Secondary | ICD-10-CM | POA: Diagnosis not present

## 2018-12-19 DIAGNOSIS — L03011 Cellulitis of right finger: Secondary | ICD-10-CM | POA: Diagnosis not present

## 2018-12-19 DIAGNOSIS — Z86018 Personal history of other benign neoplasm: Secondary | ICD-10-CM | POA: Diagnosis not present

## 2018-12-20 DIAGNOSIS — Z23 Encounter for immunization: Secondary | ICD-10-CM | POA: Diagnosis not present

## 2018-12-24 ENCOUNTER — Other Ambulatory Visit: Payer: Self-pay

## 2018-12-24 ENCOUNTER — Ambulatory Visit
Admission: RE | Admit: 2018-12-24 | Discharge: 2018-12-24 | Disposition: A | Discharge status: Home / Self Care | Payer: BLUE CROSS/BLUE SHIELD | Source: Ambulatory Visit | Attending: Orthopaedic Surgery | Admitting: Orthopaedic Surgery

## 2018-12-24 DIAGNOSIS — M25512 Pain in left shoulder: Secondary | ICD-10-CM

## 2018-12-31 DIAGNOSIS — J3089 Other allergic rhinitis: Secondary | ICD-10-CM | POA: Diagnosis not present

## 2019-01-01 ENCOUNTER — Ambulatory Visit (INDEPENDENT_AMBULATORY_CARE_PROVIDER_SITE_OTHER): Payer: BC Managed Care – PPO

## 2019-01-01 ENCOUNTER — Other Ambulatory Visit: Payer: Self-pay

## 2019-01-01 DIAGNOSIS — J309 Allergic rhinitis, unspecified: Secondary | ICD-10-CM | POA: Diagnosis not present

## 2019-01-05 NOTE — Progress Notes (Signed)
Vials exp 12-31-19

## 2019-01-09 ENCOUNTER — Other Ambulatory Visit: Payer: Self-pay | Admitting: Orthopaedic Surgery

## 2019-01-09 DIAGNOSIS — M25561 Pain in right knee: Secondary | ICD-10-CM

## 2019-01-09 DIAGNOSIS — M25512 Pain in left shoulder: Secondary | ICD-10-CM | POA: Diagnosis not present

## 2019-01-13 DIAGNOSIS — L03011 Cellulitis of right finger: Secondary | ICD-10-CM | POA: Diagnosis not present

## 2019-01-15 ENCOUNTER — Other Ambulatory Visit: Payer: Self-pay

## 2019-01-15 ENCOUNTER — Ambulatory Visit (INDEPENDENT_AMBULATORY_CARE_PROVIDER_SITE_OTHER): Payer: BC Managed Care – PPO

## 2019-01-15 DIAGNOSIS — J309 Allergic rhinitis, unspecified: Secondary | ICD-10-CM

## 2019-01-17 ENCOUNTER — Ambulatory Visit
Admission: RE | Admit: 2019-01-17 | Discharge: 2019-01-17 | Disposition: A | Discharge status: Home / Self Care | Payer: BC Managed Care – PPO | Source: Ambulatory Visit | Attending: Orthopaedic Surgery | Admitting: Orthopaedic Surgery

## 2019-01-17 ENCOUNTER — Other Ambulatory Visit: Payer: Self-pay

## 2019-01-17 DIAGNOSIS — M25561 Pain in right knee: Secondary | ICD-10-CM

## 2019-01-17 DIAGNOSIS — M23321 Other meniscus derangements, posterior horn of medial meniscus, right knee: Secondary | ICD-10-CM | POA: Diagnosis not present

## 2019-01-27 ENCOUNTER — Ambulatory Visit (INDEPENDENT_AMBULATORY_CARE_PROVIDER_SITE_OTHER): Payer: BC Managed Care – PPO

## 2019-01-27 ENCOUNTER — Other Ambulatory Visit: Payer: Self-pay

## 2019-01-27 DIAGNOSIS — J309 Allergic rhinitis, unspecified: Secondary | ICD-10-CM

## 2019-01-28 DIAGNOSIS — M25512 Pain in left shoulder: Secondary | ICD-10-CM | POA: Diagnosis not present

## 2019-01-28 DIAGNOSIS — M25561 Pain in right knee: Secondary | ICD-10-CM | POA: Diagnosis not present

## 2019-02-12 ENCOUNTER — Other Ambulatory Visit: Payer: Self-pay

## 2019-02-12 ENCOUNTER — Ambulatory Visit (INDEPENDENT_AMBULATORY_CARE_PROVIDER_SITE_OTHER): Payer: BC Managed Care – PPO

## 2019-02-12 DIAGNOSIS — J309 Allergic rhinitis, unspecified: Secondary | ICD-10-CM

## 2019-02-13 DIAGNOSIS — M67912 Unspecified disorder of synovium and tendon, left shoulder: Secondary | ICD-10-CM | POA: Diagnosis not present

## 2019-02-18 DIAGNOSIS — E291 Testicular hypofunction: Secondary | ICD-10-CM | POA: Diagnosis not present

## 2019-02-24 ENCOUNTER — Other Ambulatory Visit: Payer: Self-pay

## 2019-02-24 ENCOUNTER — Ambulatory Visit (INDEPENDENT_AMBULATORY_CARE_PROVIDER_SITE_OTHER): Payer: BC Managed Care – PPO

## 2019-02-24 DIAGNOSIS — J309 Allergic rhinitis, unspecified: Secondary | ICD-10-CM

## 2019-02-25 DIAGNOSIS — R5383 Other fatigue: Secondary | ICD-10-CM | POA: Diagnosis not present

## 2019-02-25 DIAGNOSIS — J309 Allergic rhinitis, unspecified: Secondary | ICD-10-CM | POA: Diagnosis not present

## 2019-02-25 DIAGNOSIS — N400 Enlarged prostate without lower urinary tract symptoms: Secondary | ICD-10-CM | POA: Diagnosis not present

## 2019-03-03 DIAGNOSIS — M7552 Bursitis of left shoulder: Secondary | ICD-10-CM | POA: Diagnosis not present

## 2019-03-03 DIAGNOSIS — S43432A Superior glenoid labrum lesion of left shoulder, initial encounter: Secondary | ICD-10-CM | POA: Diagnosis not present

## 2019-03-03 DIAGNOSIS — M94212 Chondromalacia, left shoulder: Secondary | ICD-10-CM | POA: Diagnosis not present

## 2019-03-03 DIAGNOSIS — X58XXXA Exposure to other specified factors, initial encounter: Secondary | ICD-10-CM | POA: Diagnosis not present

## 2019-03-03 DIAGNOSIS — Y999 Unspecified external cause status: Secondary | ICD-10-CM | POA: Diagnosis not present

## 2019-03-03 DIAGNOSIS — S83231A Complex tear of medial meniscus, current injury, right knee, initial encounter: Secondary | ICD-10-CM | POA: Diagnosis not present

## 2019-03-03 DIAGNOSIS — M7542 Impingement syndrome of left shoulder: Secondary | ICD-10-CM | POA: Diagnosis not present

## 2019-03-03 DIAGNOSIS — G8918 Other acute postprocedural pain: Secondary | ICD-10-CM | POA: Diagnosis not present

## 2019-03-03 DIAGNOSIS — M75112 Incomplete rotator cuff tear or rupture of left shoulder, not specified as traumatic: Secondary | ICD-10-CM | POA: Diagnosis not present

## 2019-03-03 DIAGNOSIS — M2241 Chondromalacia patellae, right knee: Secondary | ICD-10-CM | POA: Diagnosis not present

## 2019-03-16 DIAGNOSIS — R269 Unspecified abnormalities of gait and mobility: Secondary | ICD-10-CM | POA: Diagnosis not present

## 2019-03-16 DIAGNOSIS — M25561 Pain in right knee: Secondary | ICD-10-CM | POA: Diagnosis not present

## 2019-03-16 DIAGNOSIS — M25512 Pain in left shoulder: Secondary | ICD-10-CM | POA: Diagnosis not present

## 2019-03-16 DIAGNOSIS — R531 Weakness: Secondary | ICD-10-CM | POA: Diagnosis not present

## 2019-03-20 DIAGNOSIS — M25561 Pain in right knee: Secondary | ICD-10-CM | POA: Diagnosis not present

## 2019-03-20 DIAGNOSIS — R269 Unspecified abnormalities of gait and mobility: Secondary | ICD-10-CM | POA: Diagnosis not present

## 2019-03-20 DIAGNOSIS — M25512 Pain in left shoulder: Secondary | ICD-10-CM | POA: Diagnosis not present

## 2019-03-20 DIAGNOSIS — R531 Weakness: Secondary | ICD-10-CM | POA: Diagnosis not present

## 2019-03-23 DIAGNOSIS — M25561 Pain in right knee: Secondary | ICD-10-CM | POA: Diagnosis not present

## 2019-03-23 DIAGNOSIS — R531 Weakness: Secondary | ICD-10-CM | POA: Diagnosis not present

## 2019-03-23 DIAGNOSIS — M25512 Pain in left shoulder: Secondary | ICD-10-CM | POA: Diagnosis not present

## 2019-03-23 DIAGNOSIS — R269 Unspecified abnormalities of gait and mobility: Secondary | ICD-10-CM | POA: Diagnosis not present

## 2019-03-27 DIAGNOSIS — R269 Unspecified abnormalities of gait and mobility: Secondary | ICD-10-CM | POA: Diagnosis not present

## 2019-03-27 DIAGNOSIS — R531 Weakness: Secondary | ICD-10-CM | POA: Diagnosis not present

## 2019-03-27 DIAGNOSIS — M25512 Pain in left shoulder: Secondary | ICD-10-CM | POA: Diagnosis not present

## 2019-03-27 DIAGNOSIS — M25561 Pain in right knee: Secondary | ICD-10-CM | POA: Diagnosis not present

## 2019-03-31 ENCOUNTER — Other Ambulatory Visit: Payer: Self-pay

## 2019-03-31 ENCOUNTER — Ambulatory Visit (INDEPENDENT_AMBULATORY_CARE_PROVIDER_SITE_OTHER): Payer: BC Managed Care – PPO

## 2019-03-31 DIAGNOSIS — M25512 Pain in left shoulder: Secondary | ICD-10-CM | POA: Diagnosis not present

## 2019-03-31 DIAGNOSIS — R269 Unspecified abnormalities of gait and mobility: Secondary | ICD-10-CM | POA: Diagnosis not present

## 2019-03-31 DIAGNOSIS — J309 Allergic rhinitis, unspecified: Secondary | ICD-10-CM | POA: Diagnosis not present

## 2019-03-31 DIAGNOSIS — R531 Weakness: Secondary | ICD-10-CM | POA: Diagnosis not present

## 2019-03-31 DIAGNOSIS — M25561 Pain in right knee: Secondary | ICD-10-CM | POA: Diagnosis not present

## 2019-04-02 DIAGNOSIS — M25561 Pain in right knee: Secondary | ICD-10-CM | POA: Diagnosis not present

## 2019-04-02 DIAGNOSIS — R531 Weakness: Secondary | ICD-10-CM | POA: Diagnosis not present

## 2019-04-02 DIAGNOSIS — R269 Unspecified abnormalities of gait and mobility: Secondary | ICD-10-CM | POA: Diagnosis not present

## 2019-04-02 DIAGNOSIS — M25512 Pain in left shoulder: Secondary | ICD-10-CM | POA: Diagnosis not present

## 2019-04-07 DIAGNOSIS — M25561 Pain in right knee: Secondary | ICD-10-CM | POA: Diagnosis not present

## 2019-04-07 DIAGNOSIS — R269 Unspecified abnormalities of gait and mobility: Secondary | ICD-10-CM | POA: Diagnosis not present

## 2019-04-07 DIAGNOSIS — R531 Weakness: Secondary | ICD-10-CM | POA: Diagnosis not present

## 2019-04-07 DIAGNOSIS — M25512 Pain in left shoulder: Secondary | ICD-10-CM | POA: Diagnosis not present

## 2019-04-10 DIAGNOSIS — M25561 Pain in right knee: Secondary | ICD-10-CM | POA: Diagnosis not present

## 2019-04-10 DIAGNOSIS — R269 Unspecified abnormalities of gait and mobility: Secondary | ICD-10-CM | POA: Diagnosis not present

## 2019-04-10 DIAGNOSIS — R531 Weakness: Secondary | ICD-10-CM | POA: Diagnosis not present

## 2019-04-10 DIAGNOSIS — M25512 Pain in left shoulder: Secondary | ICD-10-CM | POA: Diagnosis not present

## 2019-04-16 DIAGNOSIS — R531 Weakness: Secondary | ICD-10-CM | POA: Diagnosis not present

## 2019-04-16 DIAGNOSIS — M25561 Pain in right knee: Secondary | ICD-10-CM | POA: Diagnosis not present

## 2019-04-16 DIAGNOSIS — R269 Unspecified abnormalities of gait and mobility: Secondary | ICD-10-CM | POA: Diagnosis not present

## 2019-04-16 DIAGNOSIS — M25512 Pain in left shoulder: Secondary | ICD-10-CM | POA: Diagnosis not present

## 2019-04-17 DIAGNOSIS — Z9889 Other specified postprocedural states: Secondary | ICD-10-CM | POA: Diagnosis not present

## 2019-04-21 DIAGNOSIS — M25512 Pain in left shoulder: Secondary | ICD-10-CM | POA: Diagnosis not present

## 2019-04-21 DIAGNOSIS — M25561 Pain in right knee: Secondary | ICD-10-CM | POA: Diagnosis not present

## 2019-04-21 DIAGNOSIS — R531 Weakness: Secondary | ICD-10-CM | POA: Diagnosis not present

## 2019-04-21 DIAGNOSIS — R269 Unspecified abnormalities of gait and mobility: Secondary | ICD-10-CM | POA: Diagnosis not present

## 2019-04-24 DIAGNOSIS — R531 Weakness: Secondary | ICD-10-CM | POA: Diagnosis not present

## 2019-04-24 DIAGNOSIS — R269 Unspecified abnormalities of gait and mobility: Secondary | ICD-10-CM | POA: Diagnosis not present

## 2019-04-24 DIAGNOSIS — M25512 Pain in left shoulder: Secondary | ICD-10-CM | POA: Diagnosis not present

## 2019-04-24 DIAGNOSIS — M25561 Pain in right knee: Secondary | ICD-10-CM | POA: Diagnosis not present

## 2019-04-28 ENCOUNTER — Ambulatory Visit (INDEPENDENT_AMBULATORY_CARE_PROVIDER_SITE_OTHER): Payer: BC Managed Care – PPO

## 2019-04-28 ENCOUNTER — Other Ambulatory Visit: Payer: Self-pay

## 2019-04-28 DIAGNOSIS — M25512 Pain in left shoulder: Secondary | ICD-10-CM | POA: Diagnosis not present

## 2019-04-28 DIAGNOSIS — J309 Allergic rhinitis, unspecified: Secondary | ICD-10-CM | POA: Diagnosis not present

## 2019-04-28 DIAGNOSIS — M25561 Pain in right knee: Secondary | ICD-10-CM | POA: Diagnosis not present

## 2019-04-28 DIAGNOSIS — R531 Weakness: Secondary | ICD-10-CM | POA: Diagnosis not present

## 2019-04-28 DIAGNOSIS — R269 Unspecified abnormalities of gait and mobility: Secondary | ICD-10-CM | POA: Diagnosis not present

## 2019-04-30 DIAGNOSIS — R531 Weakness: Secondary | ICD-10-CM | POA: Diagnosis not present

## 2019-04-30 DIAGNOSIS — M25512 Pain in left shoulder: Secondary | ICD-10-CM | POA: Diagnosis not present

## 2019-04-30 DIAGNOSIS — M25561 Pain in right knee: Secondary | ICD-10-CM | POA: Diagnosis not present

## 2019-04-30 DIAGNOSIS — R269 Unspecified abnormalities of gait and mobility: Secondary | ICD-10-CM | POA: Diagnosis not present

## 2019-05-04 DIAGNOSIS — R531 Weakness: Secondary | ICD-10-CM | POA: Diagnosis not present

## 2019-05-04 DIAGNOSIS — M25561 Pain in right knee: Secondary | ICD-10-CM | POA: Diagnosis not present

## 2019-05-04 DIAGNOSIS — M25512 Pain in left shoulder: Secondary | ICD-10-CM | POA: Diagnosis not present

## 2019-05-04 DIAGNOSIS — R269 Unspecified abnormalities of gait and mobility: Secondary | ICD-10-CM | POA: Diagnosis not present

## 2019-05-08 DIAGNOSIS — M25512 Pain in left shoulder: Secondary | ICD-10-CM | POA: Diagnosis not present

## 2019-05-08 DIAGNOSIS — R531 Weakness: Secondary | ICD-10-CM | POA: Diagnosis not present

## 2019-05-08 DIAGNOSIS — R269 Unspecified abnormalities of gait and mobility: Secondary | ICD-10-CM | POA: Diagnosis not present

## 2019-05-08 DIAGNOSIS — M25561 Pain in right knee: Secondary | ICD-10-CM | POA: Diagnosis not present

## 2019-05-15 DIAGNOSIS — M25551 Pain in right hip: Secondary | ICD-10-CM | POA: Diagnosis not present

## 2019-05-15 DIAGNOSIS — Z9889 Other specified postprocedural states: Secondary | ICD-10-CM | POA: Diagnosis not present

## 2019-05-26 ENCOUNTER — Other Ambulatory Visit: Payer: Self-pay

## 2019-05-26 ENCOUNTER — Ambulatory Visit (INDEPENDENT_AMBULATORY_CARE_PROVIDER_SITE_OTHER): Payer: BC Managed Care – PPO

## 2019-05-26 DIAGNOSIS — J309 Allergic rhinitis, unspecified: Secondary | ICD-10-CM

## 2019-06-23 ENCOUNTER — Ambulatory Visit (INDEPENDENT_AMBULATORY_CARE_PROVIDER_SITE_OTHER): Payer: BC Managed Care – PPO

## 2019-06-23 ENCOUNTER — Other Ambulatory Visit: Payer: Self-pay

## 2019-06-23 DIAGNOSIS — J309 Allergic rhinitis, unspecified: Secondary | ICD-10-CM

## 2019-07-30 ENCOUNTER — Other Ambulatory Visit: Payer: Self-pay

## 2019-07-30 ENCOUNTER — Ambulatory Visit (INDEPENDENT_AMBULATORY_CARE_PROVIDER_SITE_OTHER): Payer: BC Managed Care – PPO

## 2019-07-30 DIAGNOSIS — J309 Allergic rhinitis, unspecified: Secondary | ICD-10-CM | POA: Diagnosis not present

## 2019-08-11 ENCOUNTER — Other Ambulatory Visit: Payer: Self-pay

## 2019-08-11 ENCOUNTER — Ambulatory Visit (INDEPENDENT_AMBULATORY_CARE_PROVIDER_SITE_OTHER): Payer: BC Managed Care – PPO

## 2019-08-11 DIAGNOSIS — J309 Allergic rhinitis, unspecified: Secondary | ICD-10-CM | POA: Diagnosis not present

## 2019-08-27 ENCOUNTER — Ambulatory Visit (INDEPENDENT_AMBULATORY_CARE_PROVIDER_SITE_OTHER): Payer: BC Managed Care – PPO

## 2019-08-27 ENCOUNTER — Other Ambulatory Visit: Payer: Self-pay

## 2019-08-27 DIAGNOSIS — J309 Allergic rhinitis, unspecified: Secondary | ICD-10-CM | POA: Diagnosis not present

## 2019-09-10 ENCOUNTER — Other Ambulatory Visit: Payer: Self-pay

## 2019-09-10 ENCOUNTER — Ambulatory Visit (INDEPENDENT_AMBULATORY_CARE_PROVIDER_SITE_OTHER): Payer: BC Managed Care – PPO

## 2019-09-10 ENCOUNTER — Other Ambulatory Visit: Payer: Self-pay | Admitting: Allergy and Immunology

## 2019-09-10 DIAGNOSIS — J3089 Other allergic rhinitis: Secondary | ICD-10-CM

## 2019-09-10 DIAGNOSIS — J309 Allergic rhinitis, unspecified: Secondary | ICD-10-CM

## 2019-09-21 NOTE — Progress Notes (Signed)
Follow Up Note  RE: Jeremy [name] MRN: [id number] DOB: [birth year] Date of Office Visit: 09/22/2019  Referring provider: Delorse Limber Primary care provider: Brunetta Jeans, PA-C  Chief Complaint: Asthma  History of Present Illness: I had the pleasure of seeing Jeremy [name] for a follow up visit at the [facility] on 09/22/2019. He is a 59 y.o. male, who is being followed for allergic rhinitis on AIT and asthma. His previous allergy office visit was on 09/23/2018 with Dr. Verlin Fester. Today is a regular follow up visit.  Allergic rhinitis Patient has been on allergy injections for about 20 years with good benefit. No localized reactions and tolerating it well.  Last skin testing was in 2016 which was positive to grass, weed, ragweed, mold, cockroach and dust mites. Still has some nasal congestion during high pollen seasons.  Currently taking Xyzal or allegra at night, Astelin and Flonase in the morning.  No nosebleeds.   Met deductible for this year and would like to get repeat skin testing this year if needed.   Mild intermittent asthma/exercise-induced bronchospasm ACT score 25 Dry cough the past 3 days which is improving. Otherwise denies any SOB, wheezing, chest tightness, nocturnal awakenings, ER/urgent care visits or prednisone use since the last visit.  Patient is on human growth hormone and testosterone.   Assessment and Plan: Jeremy [name] is a 60 y.o. male with: Mild intermittent asthma without complication Well-controlled.   Today's spirometry showed some mild restriction.  May use albuterol rescue inhaler 2 puffs every 4 to 6 hours as needed for shortness of breath, chest tightness, coughing, and wheezing. May use albuterol rescue inhaler 2 puffs 5 to 15 minutes prior to strenuous physical activities. Monitor frequency of use.   Seasonal and perennial allergic rhinitis Past history - started AIT at our office in 2016 (GRASS-WEED-DMITE &  MOLD-CR). Prior to that he was on allergy injections for 15 years at other local allergist. 2016 skin testing was positive to grass, weed, ragweed, mold, cockroach and dust mites. Interim history - doing well with below regimen. Still has some flares during high pollen seasons.  Continue environmental control measures.  Continue allergy injections every 4 weeks for now.  Will repeat skin testing and adjust or stop depending on results.  May use over the counter antihistamines such as Zyrtec (cetirizine), Claritin (loratadine), Allegra (fexofenadine), or Xyzal (levocetirizine) daily as needed.  May take twice a day if needed.  Continue with "purple" lubricating eye drops.   May use azelastine nasal spray 1-2 sprays per nostril twice a day as needed for runny nose/drainage.  Increase to twice a day until coughing from post nasal drip improves.   May use Flonase (fluticasone) nasal spray 2 spray per nostril once a day as needed for nasal congestion.   Return for Skin testing.  Meds ordered this encounter  Medications  . Azelastine HCl 0.15 % SOLN    Sig: Place 1-2 sprays into the nose 2 (two) times daily as needed (runny nose, post nasal drip).    Dispense:  90 mL    Refill:  3   Diagnostics: Spirometry:  Tracings reviewed. His effort: Good reproducible efforts. FVC: 3.88L FEV1: 3.18L, 85% predicted FEV1/FVC ratio: 82% Interpretation: Spirometry consistent with possible restrictive disease.  Please see scanned spirometry results for details.  Medication List:  Current Outpatient Medications  Medication Sig Dispense Refill  . anastrozole (ARIMIDEX) 1 MG tablet Take 1 mg by mouth 2 (two) times a  week.    . Ascorbic Acid (VITAMIN C) 100 MG tablet Take 1 tablet by mouth daily.    . Azelastine HCl 0.15 % SOLN Place 1-2 sprays into the nose 2 (two) times daily as needed (runny nose, post nasal drip). 90 mL 3  . B-D 3CC LUER-LOK SYR 20GX1" 20G X 1" 3 ML MISC USE TO INJECT ONCE WEEKLY     . Betamethasone Sodium Phosphate 6 MG/ML SOLN Inject 1 vial    INJECT 2 CC OF BETAMETHASONE AND 4 CC OF 2% LIDOCAINE INTO RIGHT SI JOINT    . clomiPHENE (CLOMID) 50 MG tablet Take 25 mg by mouth daily.     Marland Kitchen EPIPEN 2-PAK 0.3 MG/0.3ML SOAJ injection USE AS DIRECTED FOR SEVERE ALLERGIC REACTION 1 Device 1  . fexofenadine (ALLEGRA) 180 MG tablet Take 1 tablet (180 mg total) by mouth daily. Alternate every few months with Xyzal. 30 tablet 5  . fluticasone (FLONASE) 50 MCG/ACT nasal spray Place 2 sprays into both nostrils daily. 48 g 1  . levocetirizine (XYZAL) 5 MG tablet Take 1 tablet (5 mg total) by mouth daily. 90 tablet 2  . Magnesium Gluconate 550 MG TABS Take 1 tablet by mouth 2 (two) times daily.    . sildenafil (REVATIO) 20 MG tablet Take 20 mg by mouth 3 (three) times daily.    Marland Kitchen testosterone cypionate (DEPOTESTOSTERONE CYPIONATE) 200 MG/ML injection 2.3 ml IM every other week    . traZODone (DESYREL) 100 MG tablet Take 100 mg by mouth at bedtime.    . Zinc 50 MG TABS Take 1 tablet by mouth daily.    . Albuterol Sulfate (PROAIR RESPICLICK) 790 (90 Base) MCG/ACT AEPB Inhale 2 puffs into the lungs every 4 (four) hours as needed. For cough or wheeze.  May use 2 puffs 10-20 minutes prior to exercise. (Patient not taking: Reported on 09/22/2019) 3 each 1   No current facility-administered medications for this visit.   Allergies: Allergies  Allergen Reactions  . Adhesive [Tape] Rash   I reviewed his past medical history, social history, family history, and environmental history and no significant changes have been reported from his previous visit.  Review of Systems  Constitutional: Negative for appetite change, chills, fever and unexpected weight change.  HENT: Negative for congestion and rhinorrhea.   Eyes: Negative for itching.  Respiratory: Positive for cough. Negative for chest tightness, shortness of breath and wheezing.   Gastrointestinal: Negative for abdominal pain.  Skin:  Negative for rash.  Allergic/Immunologic: Positive for environmental allergies.  Neurological: Negative for headaches.   Objective: BP 122/76   Pulse 64   Resp 17   Wt 191 lb (86.6 kg)   SpO2 99%   BMI 26.64 kg/m  Body mass index is 26.64 kg/m. Physical Exam Vitals and nursing note reviewed.  Constitutional:      Appearance: Normal appearance. He is well-developed.  HENT:     Head: Normocephalic and atraumatic.     Right Ear: Tympanic membrane and external ear normal.     Left Ear: Tympanic membrane and external ear normal.     Nose: Nose normal.     Mouth/Throat:     Mouth: Mucous membranes are moist.     Comments: cobblestoning Eyes:     Conjunctiva/sclera: Conjunctivae normal.  Cardiovascular:     Rate and Rhythm: Normal rate and regular rhythm.     Heart sounds: Normal heart sounds. No murmur heard.   Pulmonary:     Effort: Pulmonary effort is  normal.     Breath sounds: Normal breath sounds. No wheezing, rhonchi or rales.  Musculoskeletal:     Cervical back: Neck supple.  Skin:    General: Skin is warm.     Findings: No rash.  Neurological:     Mental Status: He is alert and oriented to person, place, and time.  Psychiatric:        Behavior: Behavior normal.    Previous notes and tests were reviewed. The plan was reviewed with the patient/family, and all questions/concerned were addressed.  It was my pleasure to see Jeremy [name] today and participate in his care. Please feel free to contact me with any questions or concerns.  Sincerely,  Rexene Alberts, DO Allergy & Immunology  [facility] office: [phone] [facility] office: [place] office: [phone]

## 2019-09-22 ENCOUNTER — Ambulatory Visit (INDEPENDENT_AMBULATORY_CARE_PROVIDER_SITE_OTHER): Payer: BC Managed Care – PPO | Admitting: Allergy

## 2019-09-22 ENCOUNTER — Other Ambulatory Visit: Payer: Self-pay

## 2019-09-22 ENCOUNTER — Encounter: Payer: Self-pay | Admitting: Allergy

## 2019-09-22 VITALS — BP 122/76 | HR 64 | Resp 17 | Wt 191.0 lb

## 2019-09-22 DIAGNOSIS — J302 Other seasonal allergic rhinitis: Secondary | ICD-10-CM | POA: Diagnosis not present

## 2019-09-22 DIAGNOSIS — J4520 Mild intermittent asthma, uncomplicated: Secondary | ICD-10-CM

## 2019-09-22 DIAGNOSIS — J3089 Other allergic rhinitis: Secondary | ICD-10-CM

## 2019-09-22 DIAGNOSIS — J309 Allergic rhinitis, unspecified: Secondary | ICD-10-CM | POA: Diagnosis not present

## 2019-09-22 MED ORDER — AZELASTINE HCL 0.15 % NA SOLN: 1.0000 | Freq: Two times a day (BID) | NASAL | 3 refills | Status: DC | PRN

## 2019-09-22 NOTE — Patient Instructions (Addendum)
Allergic rhinitis:  Continue environmental control measures.  Continue allergy injections every 4 weeks for now.  Will repeat skin testing and adjust or stop depending on results.  May use over the counter antihistamines such as Zyrtec (cetirizine), Claritin (loratadine), Allegra (fexofenadine), or Xyzal (levocetirizine) daily as needed.  May take twice a day if needed.  Continue with "purple" lubricating eye drops.   May use azelastine nasal spray 1-2 sprays per nostril twice a day as needed for runny nose/drainage.  Increase to twice a day until coughing from post nasal drip improves.   May use Flonase (fluticasone) nasal spray 2 spray per nostril once a day as needed for nasal congestion.   Asthma:  Your breathing test was slightly worse than previous.  May use albuterol rescue inhaler 2 puffs every 4 to 6 hours as needed for shortness of breath, chest tightness, coughing, and wheezing. May use albuterol rescue inhaler 2 puffs 5 to 15 minutes prior to strenuous physical activities. Monitor frequency of use.   Follow up in 1 year or sooner if needed for regular follow up. Follow up for skin testing - must be off antihistamines and azelastine nasal spray for 3 days.

## 2019-09-22 NOTE — Assessment & Plan Note (Addendum)
Well-controlled.   Today's spirometry showed some mild restriction.  May use albuterol rescue inhaler 2 puffs every 4 to 6 hours as needed for shortness of breath, chest tightness, coughing, and wheezing. May use albuterol rescue inhaler 2 puffs 5 to 15 minutes prior to strenuous physical activities. Monitor frequency of use.

## 2019-09-22 NOTE — Assessment & Plan Note (Addendum)
Past history - started AIT at our office in 2016 (GRASS-WEED-DMITE & MOLD-CR). Prior to that he was on allergy injections for 15 years at other local allergist. 2016 skin testing was positive to grass, weed, ragweed, mold, cockroach and dust mites. Interim history - doing well with below regimen. Still has some flares during high pollen seasons.  Continue environmental control measures.  Continue allergy injections every 4 weeks for now.  Will repeat skin testing and adjust or stop depending on results.  May use over the counter antihistamines such as Zyrtec (cetirizine), Claritin (loratadine), Allegra (fexofenadine), or Xyzal (levocetirizine) daily as needed.  May take twice a day if needed.  Continue with "purple" lubricating eye drops.   May use azelastine nasal spray 1-2 sprays per nostril twice a day as needed for runny nose/drainage.  Increase to twice a day until coughing from post nasal drip improves.   May use Flonase (fluticasone) nasal spray 2 spray per nostril once a day as needed for nasal congestion.

## 2019-10-21 NOTE — Progress Notes (Signed)
Follow Up Note  RE: Jeremy [name] MRN: [id number] DOB: March 03, 1960 Date of Office Visit: 10/22/2019  Referring provider: Noel Journey Primary care provider: Waldon Merl, PA-C  Chief Complaint: Allergy Testing  History of Present Illness: I had the pleasure of seeing Jeremy [name] for a follow up visit at the [facility] on 10/22/2019. He is a 59 y.o. male, who is being followed for asthma, allergic rhinitis. His previous allergy office visit was on 09/22/2019 with Dr. Selena Batten. Today is a Skin testing visit.  Seasonal and perennial allergic rhinitis Noticed some itching of the skin and nasal congestion since off the antihistamines.   Assessment and Plan: Jeremy [name] is a 59 y.o. male with: Seasonal and perennial allergic rhinitis Past history - started AIT at our office in 2016 (GRASS-WEED-DMITE & MOLD-CR). Prior to that he was on allergy injections for 15 years at other local allergist. 2016 skin testing was positive to grass, weed, ragweed, mold, cockroach and dust mites. Interim history - noticed some increased nasal congestion and itchy skin since off antihistamines.  Today's skin testing showed: Borderline positive to mold and cockroach.   Patient most likely has a component of non-allergic rhinitis as well.   Continue environmental control measures as below.  May use over the counter antihistamines such as Zyrtec (cetirizine), Claritin (loratadine), Allegra (fexofenadine), or Xyzal (levocetirizine) daily as needed.  Continue allergy injections every 4 weeks until current vial is finished.  If you notice worsening symptoms then we can restart the injections with mold and cockroach only.   Continue with "purple" lubricating eye drops.   May use azelastine nasal spray 1-2 sprays per nostril twice a day as needed for runny nose/drainage.  Increase to twice a day until coughing from post nasal drip improves.   May use Flonase (fluticasone) nasal  spray 2 spray per nostril once a day as needed for nasal congestion.   Mild intermittent asthma without complication Well-controlled.   May use albuterol rescue inhaler 2 puffs every 4 to 6 hours as needed for shortness of breath, chest tightness, coughing, and wheezing. May use albuterol rescue inhaler 2 puffs 5 to 15 minutes prior to strenuous physical activities. Monitor frequency of use.   Return in about 1 year (around 10/21/2020).  Diagnostics: Skin Testing: Environmental allergy panel. Intradermal testing borderline positive to mold #2 and cockroach.  Results discussed with patient/family.  Airborne Adult Perc - 10/22/19 1048    Time Antigen Placed 1048    Allergen Manufacturer Greer    Location Back    Number of Test 59    Panel 1 Select    1. Control-Buffer 50% Glycerol Negative    2. Control-Histamine 1 mg/ml 2+    3. Albumin saline Negative    4. Bahia Negative    5. French Southern Territ[place] Negative    6. Johnson Negative    7. Kentucky Blue Negative    8. Meadow Fescue Negative    9. Perennial Rye Negative    10. Sweet Vernal Negative    11. Timothy Negative    12. Cocklebur Negative    13. Burweed Marshelder Negative    14. Ragweed, short Negative    15. Ragweed, Giant Negative    16. Plantain,  English Negative    17. Lamb's Quarters Negative    18. Sheep Sorrell Negative    19. Rough Pigweed Negative    20. Marsh Elder, Rough Negative    21. Mugwort, Common Negative  22. Ash mix Negative    23. Birch mix Negative    24. Beech American Negative    25. Box, Elder Negative    26. Cedar, red Negative    27. Cottonwood, Guinea-Bissau Negative    28. Elm mix Negative    29. Hickory Negative    30. Maple mix Negative    31. Oak, Guinea-Bissau mix Negative    32. Pecan Pollen Negative    33. Pine mix Negative    34. Sycamore Eastern Negative    35. Walnut, Black Pollen Negative    36. Alternaria alternata Negative    37. Cladosporium Herbarum Negative    38. Aspergillus mix  Negative    39. Penicillium mix Negative    40. Bipolaris sorokiniana (Helminthosporium) Negative    41. Drechslera spicifera (Curvularia) Negative    42. Mucor plumbeus Negative    43. Fusarium moniliforme Negative    44. Aureobasidium pullulans (pullulara) Negative    45. Rhizopus oryzae Negative    46. Botrytis cinera Negative    47. Epicoccum nigrum Negative    48. Phoma betae Negative    49. Candida Albicans Negative    50. Trichophyton mentagrophytes Negative    51. Mite, D Farinae  5,000 AU/ml Negative    52. Mite, D Pteronyssinus  5,000 AU/ml Negative    53. Cat Hair 10,000 BAU/ml Negative    54.  Dog Epithelia Negative    55. Mixed Feathers Negative    56. Horse Epithelia Negative    57. Cockroach, German Negative    58. Mouse Negative    59. Tobacco Leaf Negative          Intradermal - 10/22/19 1125    Time Antigen Placed 1125    Allergen Manufacturer Waynette Buttery    Location Back    Number of Test 15    Intradermal Select    Control Negative   Simultaneous filing. User may not have seen previous data.   [place] Negative   Simultaneous filing. User may not have seen previous data.   Johnson Negative   Simultaneous filing. User may not have seen previous data.   7 Grass Negative   Simultaneous filing. User may not have seen previous data.   Ragweed mix Negative   Simultaneous filing. User may not have seen previous data.   Weed mix Negative   Simultaneous filing. User may not have seen previous data.   Tree mix Negative   Simultaneous filing. User may not have seen previous data.   Mold 1 Negative   Simultaneous filing. User may not have seen previous data.   Mold 2 Negative   +/-  Simultaneous filing. User may not have seen previous data.   Mold 3 Negative   Simultaneous filing. User may not have seen previous data.   Mold 4 Negative   Simultaneous filing. User may not have seen previous data.   Cat Negative   Simultaneous filing. User may not have seen previous data.    Dog Negative   Simultaneous filing. User may not have seen previous data.   Cockroach --   +/-  Simultaneous filing. User may not have seen previous data.   Mite mix Negative   Simultaneous filing. User may not have seen previous data.          Medication List:  Current Outpatient Medications  Medication Sig Dispense Refill  . Albuterol Sulfate (PROAIR RESPICLICK) 108 (90 Base) MCG/ACT AEPB Inhale 2 puffs into the lungs every 4 (  four) hours as needed. For cough or wheeze.  May use 2 puffs 10-20 minutes prior to exercise. 3 each 1  . anastrozole (ARIMIDEX) 1 MG tablet Take 1 mg by mouth 2 (two) times a week.    . Ascorbic Acid (VITAMIN C) 100 MG tablet Take 1 tablet by mouth daily.    . Azelastine HCl 0.15 % SOLN Place 1-2 sprays into the nose 2 (two) times daily as needed (runny nose, post nasal drip). 90 mL 3  . B-D 3CC LUER-LOK SYR 20GX1" 20G X 1" 3 ML MISC USE TO INJECT ONCE WEEKLY    . Betamethasone Sodium Phosphate 6 MG/ML SOLN Inject 1 vial    INJECT 2 CC OF BETAMETHASONE AND 4 CC OF 2% LIDOCAINE INTO RIGHT SI JOINT    . clomiPHENE (CLOMID) 50 MG tablet Take 25 mg by mouth daily.     Marland Kitchen EPIPEN 2-PAK 0.3 MG/0.3ML SOAJ injection USE AS DIRECTED FOR SEVERE ALLERGIC REACTION 1 Device 1  . fexofenadine (ALLEGRA) 180 MG tablet Take 1 tablet (180 mg total) by mouth daily. Alternate every few months with Xyzal. 30 tablet 5  . fluticasone (FLONASE) 50 MCG/ACT nasal spray Place 2 sprays into both nostrils daily. 48 g 1  . levocetirizine (XYZAL) 5 MG tablet Take 1 tablet (5 mg total) by mouth daily. 90 tablet 2  . Magnesium Gluconate 550 MG TABS Take 1 tablet by mouth 2 (two) times daily.    . sildenafil (REVATIO) 20 MG tablet Take 20 mg by mouth 3 (three) times daily.    Marland Kitchen testosterone cypionate (DEPOTESTOSTERONE CYPIONATE) 200 MG/ML injection 2.3 ml IM every other week    . traZODone (DESYREL) 100 MG tablet Take 100 mg by mouth at bedtime.    . Zinc 50 MG TABS Take 1 tablet by mouth daily.      No current facility-administered medications for this visit.   Allergies: Allergies  Allergen Reactions  . Adhesive [Tape] Rash   I reviewed his past medical history, social history, family history, and environmental history and no significant changes have been reported from his previous visit.  Review of Systems  Constitutional: Negative for appetite change, chills, fever and unexpected weight change.  HENT: Positive for congestion and rhinorrhea.   Eyes: Negative for itching.  Respiratory: Negative for cough, chest tightness, shortness of breath and wheezing.   Gastrointestinal: Negative for abdominal pain.  Skin: Negative for rash.  Allergic/Immunologic: Positive for environmental allergies.  Neurological: Negative for headaches.   Objective: There were no vitals taken for this visit. There is no height or weight on file to calculate BMI. Physical Exam Vitals and nursing note reviewed.  Constitutional:      Appearance: Normal appearance. He is well-developed.  HENT:     Head: Normocephalic and atraumatic.     Right Ear: External ear normal.     Left Ear: Tympanic membrane and external ear normal.     Ears:     Comments: Scarring on right TM    Nose: Nose normal.     Mouth/Throat:     Mouth: Mucous membranes are moist.     Pharynx: Oropharynx is clear.     Comments: cobblestoning Eyes:     Conjunctiva/sclera: Conjunctivae normal.  Cardiovascular:     Rate and Rhythm: Normal rate and regular rhythm.     Heart sounds: Normal heart sounds. No murmur heard.   Pulmonary:     Effort: Pulmonary effort is normal.     Breath sounds: Normal  breath sounds. No wheezing, rhonchi or rales.  Musculoskeletal:     Cervical back: Neck supple.  Skin:    General: Skin is warm.     Findings: No rash.  Neurological:     Mental Status: He is alert and oriented to person, place, and time.  Psychiatric:        Behavior: Behavior normal.    Previous notes and tests were  reviewed. The plan was reviewed with the patient/family, and all questions/concerned were addressed.  It was my pleasure to see Jeremy [name] today and participate in his care. Please feel free to contact me with any questions or concerns.  Sincerely,  Wyline MoodYoon Maley Venezia, DO Allergy & Immunology  [facility] office: [phone] [facility] office: [phone]

## 2019-10-22 ENCOUNTER — Other Ambulatory Visit: Payer: Self-pay

## 2019-10-22 ENCOUNTER — Encounter: Payer: Self-pay | Admitting: Allergy

## 2019-10-22 ENCOUNTER — Ambulatory Visit (INDEPENDENT_AMBULATORY_CARE_PROVIDER_SITE_OTHER): Payer: BC Managed Care – PPO | Admitting: Allergy

## 2019-10-22 DIAGNOSIS — J3089 Other allergic rhinitis: Secondary | ICD-10-CM | POA: Diagnosis not present

## 2019-10-22 DIAGNOSIS — J4520 Mild intermittent asthma, uncomplicated: Secondary | ICD-10-CM

## 2019-10-22 DIAGNOSIS — J302 Other seasonal allergic rhinitis: Secondary | ICD-10-CM

## 2019-10-22 NOTE — Patient Instructions (Addendum)
Today's skin testing showed: Borderline positive to mold and cockroach.   Environmental allergies  Continue environmental control measures as below.  May use over the counter antihistamines such as Zyrtec (cetirizine), Claritin (loratadine), Allegra (fexofenadine), or Xyzal (levocetirizine) daily as needed.  Continue allergy injections every 4 weeks until the vial is finished.  If you notice worsening symptoms we can restart the injections with mold and cockroach only.   Continue with "purple" lubricating eye drops.   May use azelastine nasal spray 1-2 sprays per nostril twice a day as needed for runny nose/drainage.  Increase to twice a day until coughing from post nasal drip improves.   May use Flonase (fluticasone) nasal spray 2 spray per nostril once a day as needed for nasal congestion.   Asthma:  May use albuterol rescue inhaler 2 puffs every 4 to 6 hours as needed for shortness of breath, chest tightness, coughing, and wheezing. May use albuterol rescue inhaler 2 puffs 5 to 15 minutes prior to strenuous physical activities. Monitor frequency of use.   Follow up in 12 months or sooner if needed.

## 2019-10-22 NOTE — Assessment & Plan Note (Signed)
Past history - started AIT at our office in 2016 (GRASS-WEED-DMITE & MOLD-CR). Prior to that he was on allergy injections for 15 years at other local allergist. 2016 skin testing was positive to grass, weed, ragweed, mold, cockroach and dust mites. Interim history - noticed some increased nasal congestion and itchy skin since off antihistamines.  Today's skin testing showed: Borderline positive to mold and cockroach.   Patient most likely has a component of non-allergic rhinitis as well.   Continue environmental control measures as below.  May use over the counter antihistamines such as Zyrtec (cetirizine), Claritin (loratadine), Allegra (fexofenadine), or Xyzal (levocetirizine) daily as needed.  Continue allergy injections every 4 weeks until current vial is finished.  If you notice worsening symptoms then we can restart the injections with mold and cockroach only.   Continue with "purple" lubricating eye drops.   May use azelastine nasal spray 1-2 sprays per nostril twice a day as needed for runny nose/drainage.  Increase to twice a day until coughing from post nasal drip improves.   May use Flonase (fluticasone) nasal spray 2 spray per nostril once a day as needed for nasal congestion.

## 2019-10-22 NOTE — Assessment & Plan Note (Signed)
Well-controlled.  ?? May use albuterol rescue inhaler 2 puffs every 4 to 6 hours as needed for shortness of breath, chest tightness, coughing, and wheezing. May use albuterol rescue inhaler 2 puffs 5 to 15 minutes prior to strenuous physical activities. Monitor frequency of use.  ?

## 2019-10-29 ENCOUNTER — Ambulatory Visit (INDEPENDENT_AMBULATORY_CARE_PROVIDER_SITE_OTHER): Payer: BC Managed Care – PPO

## 2019-10-29 ENCOUNTER — Other Ambulatory Visit: Payer: Self-pay

## 2019-10-29 DIAGNOSIS — J309 Allergic rhinitis, unspecified: Secondary | ICD-10-CM

## 2019-11-30 DIAGNOSIS — J3089 Other allergic rhinitis: Secondary | ICD-10-CM | POA: Diagnosis not present

## 2019-11-30 NOTE — Progress Notes (Signed)
Vials exp 11-29-20 

## 2019-12-02 IMAGING — US US EXTREM UP*L* LTD
1 series · 14 of 14 positions shown · non-contrast
Comparison: None.

CLINICAL DATA: Knot on the palm of the left hand at the mid third
metacarpal.

EXAM:
ULTRASOUND LEFT UPPER EXTREMITY LIMITED
TECHNIQUE: Ultrasound examination of the upper extremity soft tissues was
performed in the area of clinical concern.

[Series 1: us extrem up*left* ltd · 0.06mm/px · 14 of 14 slices shown]
[im 1/14]
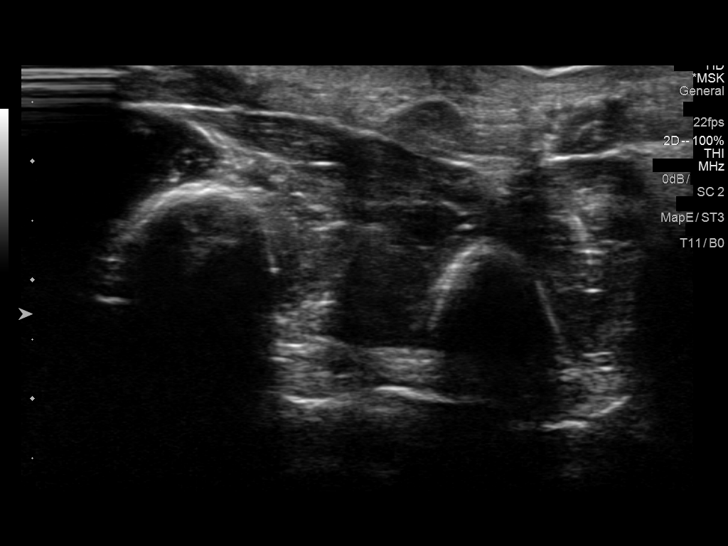
[im 2/14]
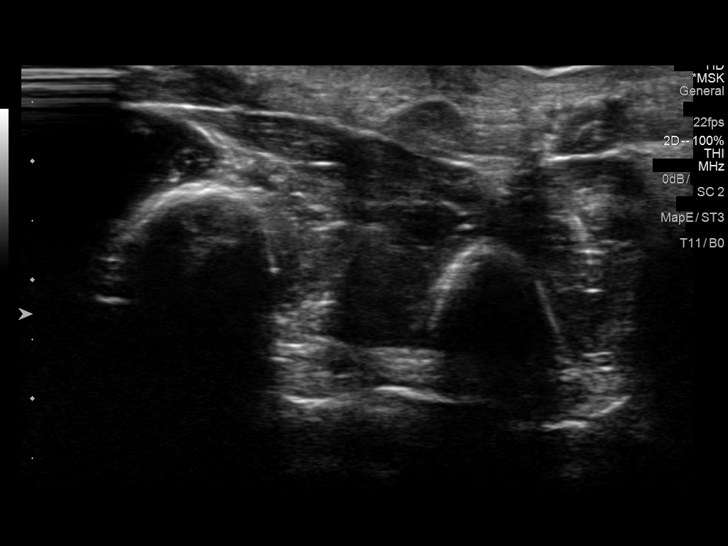
[im 3/14]
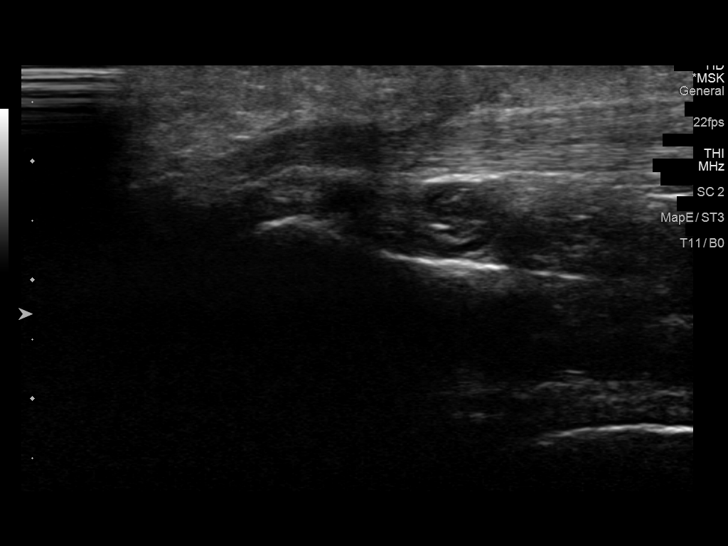
[im 4/14]
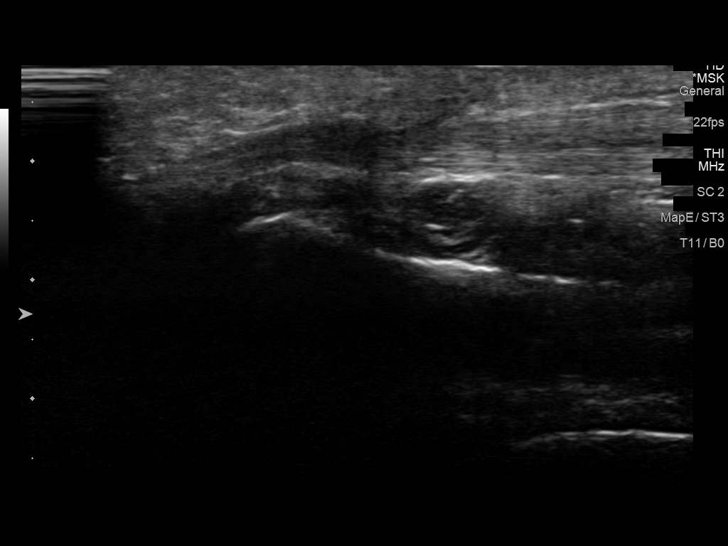
[im 5/14]
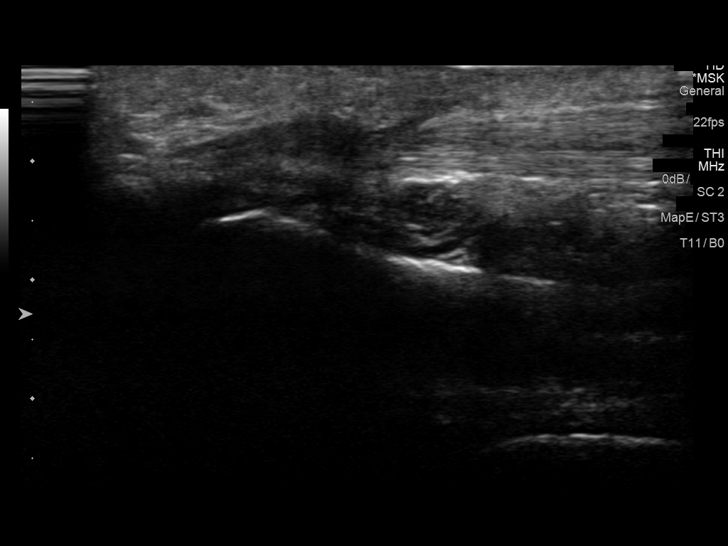
[im 6/14]
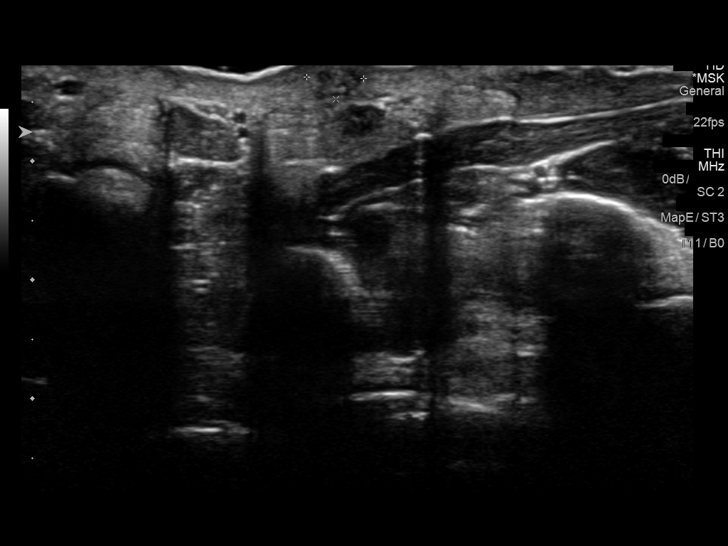
[im 7/14]
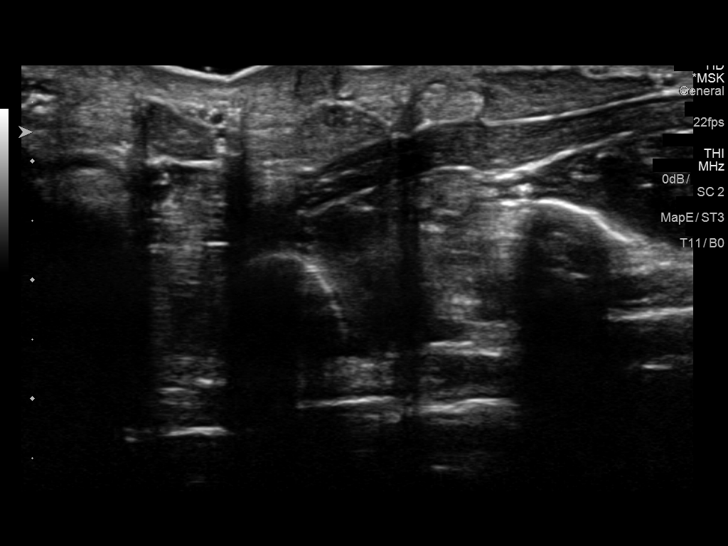
[im 8/14]
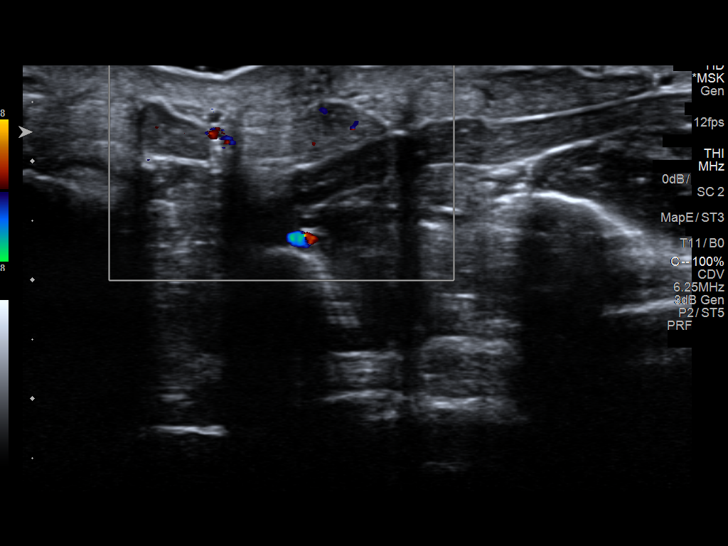
[im 9/14]
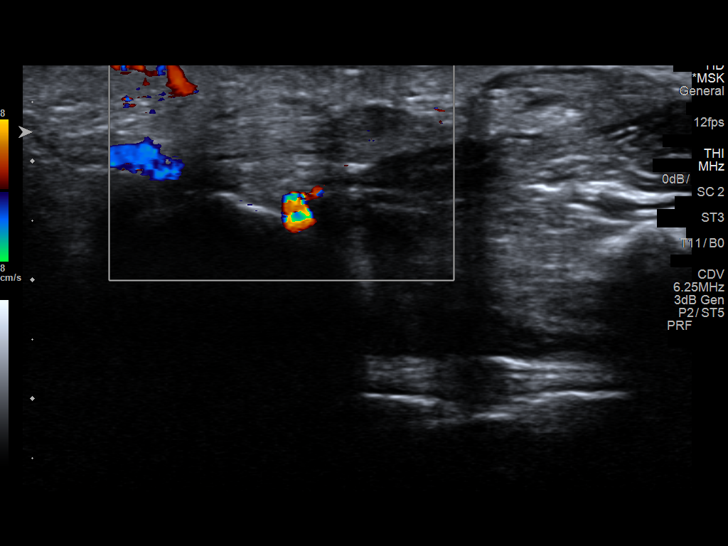
[im 10/14]
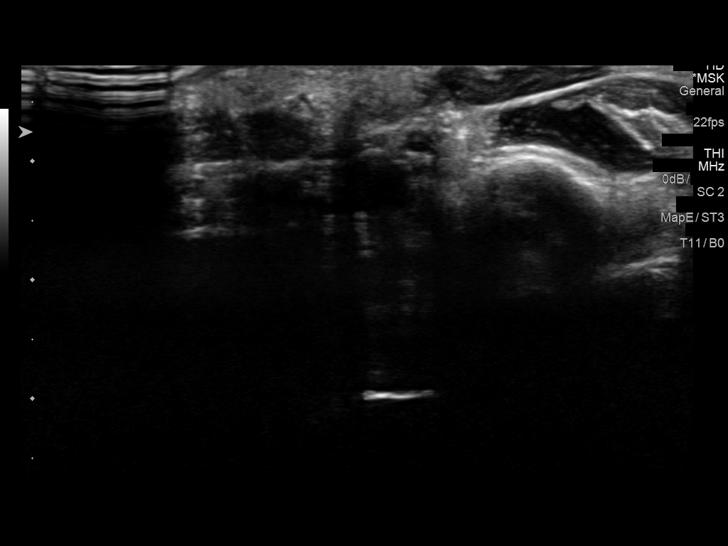
[im 11/14]
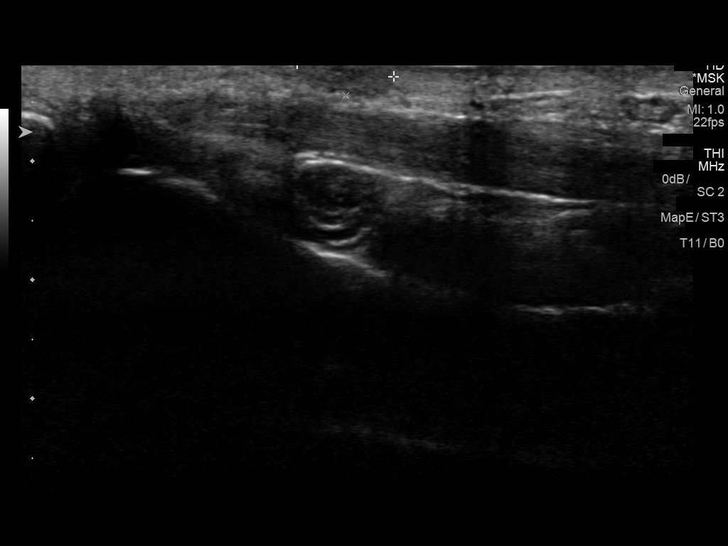
[im 12/14]
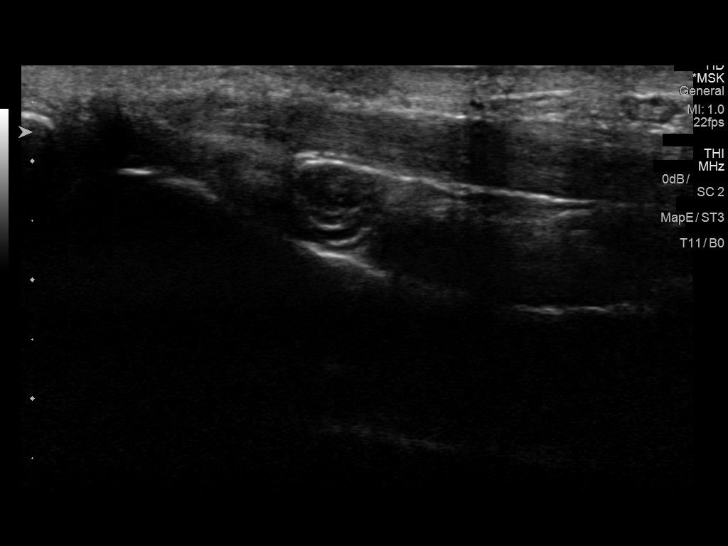
[im 13/14]
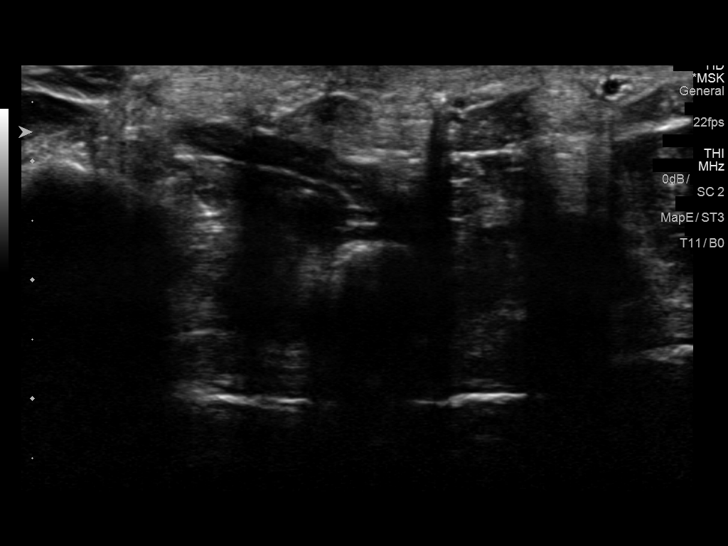
[im 14/14]
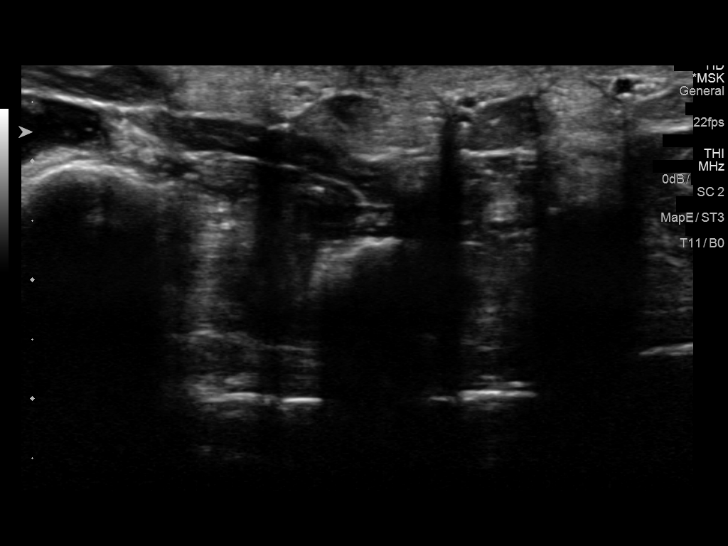

[14 of 14 positions shown; findings below may reference images not displayed]

FINDINGS: Scanning was directed toward the region of concern. An intermediate
to slightly hypoechoic subcutaneous nodule measuring 0.8 x 0.4 x
cm is identified. No flow is identified in association with the
lesion.
IMPRESSION: Small subcutaneous nodule on the region of concern could be related
to the patient's history of Dupuytren's contracture but is
nonspecific. The lesion does not appear aggressive.

## 2019-12-03 ENCOUNTER — Ambulatory Visit (INDEPENDENT_AMBULATORY_CARE_PROVIDER_SITE_OTHER): Payer: BC Managed Care – PPO

## 2019-12-03 ENCOUNTER — Other Ambulatory Visit: Payer: Self-pay

## 2019-12-03 DIAGNOSIS — J309 Allergic rhinitis, unspecified: Secondary | ICD-10-CM | POA: Diagnosis not present

## 2019-12-17 ENCOUNTER — Ambulatory Visit (INDEPENDENT_AMBULATORY_CARE_PROVIDER_SITE_OTHER): Payer: BC Managed Care – PPO

## 2019-12-17 ENCOUNTER — Other Ambulatory Visit: Payer: Self-pay

## 2019-12-17 DIAGNOSIS — J309 Allergic rhinitis, unspecified: Secondary | ICD-10-CM | POA: Diagnosis not present

## 2020-01-08 ENCOUNTER — Encounter: Payer: Self-pay | Admitting: Physician Assistant

## 2020-01-08 NOTE — Telephone Encounter (Signed)
Make sure he is taking Vitamin D3 1000 units daily, Vitamin C 1000 mg daily and OTC zinc supplement. See about getting him scheduled for the COVID testing clinic tomorrow with [facility].

## 2020-01-18 ENCOUNTER — Encounter: Payer: Self-pay | Admitting: Physician Assistant

## 2020-01-19 ENCOUNTER — Encounter: Payer: Self-pay | Admitting: Physician Assistant

## 2020-01-19 ENCOUNTER — Other Ambulatory Visit: Payer: Self-pay

## 2020-01-19 ENCOUNTER — Telehealth (INDEPENDENT_AMBULATORY_CARE_PROVIDER_SITE_OTHER): Payer: BC Managed Care – PPO | Admitting: Physician Assistant

## 2020-01-19 DIAGNOSIS — U099 Post covid-19 condition, unspecified: Secondary | ICD-10-CM | POA: Diagnosis not present

## 2020-01-19 DIAGNOSIS — J0100 Acute maxillary sinusitis, unspecified: Secondary | ICD-10-CM

## 2020-01-19 MED ORDER — AMOXICILLIN-POT CLAVULANATE 875-125 MG PO TABS: 1.0000 | ORAL_TABLET | Freq: Two times a day (BID) | ORAL | 0 refills | Status: DC

## 2020-01-19 MED ORDER — PROMETHAZINE-DM 6.25-15 MG/5ML PO SYRP: 5.0000 mL | ORAL_SOLUTION | Freq: Four times a day (QID) | ORAL | 0 refills | Status: DC | PRN

## 2020-01-19 NOTE — Progress Notes (Signed)
Virtual Visit via Video   I connected with patient on 01/19/20 at  3:30 PM EST by a video enabled telemedicine application and verified that I am speaking with the correct person using two identifiers.  Location patient: Home Location provider: [facility] Persons participating in the virtual visit: Patient, Provider, CMA (Patina Moore)  I discussed the limitations of evaluation and management by telemedicine and the availability of in person appointments. The patient expressed understanding and agreed to proceed.  Subjective:   HPI:  Patient presents via [facility] today to discuss ongoing symptoms after COVID diagnosis. Initially exposed on 12/31 then started symptoms on 01/06/2019 with chest congestion, nasal congestion, sinus pressure, etc. Tested positive for COVID later that week. Notes he has been improving overall. No residual chills, aches, fatigue. Still having substantial cough, now with sinus pressure/sinuspain despite use of saline nasal rinse. Has noted some sinus pain. Quite a bit of ear pressure bilaterally with R > L but without substantial pain. Is taking Dayquil, Nyquil, Vitamin regimen and Chest-All..   ROS:   See pertinent positives and negatives per HPI.  Patient Active Problem List   Diagnosis Date Noted  . Sensorineural hearing loss (SNHL), bilateral 11/09/2016  . Temporomandibular jaw dysfunction 11/09/2016  . Osteitis pubis (HCC) 12/06/2014  . Seasonal and perennial allergic rhinitis 09/11/2014  . Mild intermittent asthma without complication 09/11/2014    Social History   Tobacco Use  . Smoking status: Never Smoker  . Smokeless tobacco: Never Used  Substance Use Topics  . Alcohol use: Yes    Alcohol/week: 0.0 standard drinks    Current Outpatient Medications:  .  Albuterol Sulfate (PROAIR RESPICLICK) 108 (90 Base) MCG/ACT AEPB, Inhale 2 puffs into the lungs every 4 (four) hours as needed. For cough or wheeze.  May use 2 puffs 10-20  minutes prior to exercise., Disp: 3 each, Rfl: 1 .  anastrozole (ARIMIDEX) 1 MG tablet, Take 1 mg by mouth 2 (two) times a week., Disp: , Rfl:  .  Ascorbic Acid (VITAMIN C) 100 MG tablet, Take 1 tablet by mouth daily., Disp: , Rfl:  .  Azelastine HCl 0.15 % SOLN, Place 1-2 sprays into the nose 2 (two) times daily as needed (runny nose, post nasal drip)., Disp: 90 mL, Rfl: 3 .  B-D 3CC LUER-LOK SYR 20GX1" 20G X 1" 3 ML MISC, USE TO INJECT ONCE WEEKLY, Disp: , Rfl:  .  Betamethasone Sodium Phosphate 6 MG/ML SOLN, Inject 1 vial    INJECT 2 CC OF BETAMETHASONE AND 4 CC OF 2% LIDOCAINE INTO RIGHT SI JOINT, Disp: , Rfl:  .  clomiPHENE (CLOMID) 50 MG tablet, Take 25 mg by mouth daily. , Disp: , Rfl:  .  EPIPEN 2-PAK 0.3 MG/0.3ML SOAJ injection, USE AS DIRECTED FOR SEVERE ALLERGIC REACTION, Disp: 1 Device, Rfl: 1 .  fexofenadine (ALLEGRA) 180 MG tablet, Take 1 tablet (180 mg total) by mouth daily. Alternate every few months with Xyzal., Disp: 30 tablet, Rfl: 5 .  fluticasone (FLONASE) 50 MCG/ACT nasal spray, Place 2 sprays into both nostrils daily., Disp: 48 g, Rfl: 1 .  levocetirizine (XYZAL) 5 MG tablet, Take 1 tablet (5 mg total) by mouth daily., Disp: 90 tablet, Rfl: 2 .  Magnesium Gluconate 550 MG TABS, Take 1 tablet by mouth 2 (two) times daily., Disp: , Rfl:  .  sildenafil (REVATIO) 20 MG tablet, Take 20 mg by mouth 3 (three) times daily., Disp: , Rfl:  .  testosterone cypionate (DEPOTESTOSTERONE CYPIONATE) 200  MG/ML injection, 2.3 ml IM every other week, Disp: , Rfl:  .  traZODone (DESYREL) 100 MG tablet, Take 100 mg by mouth at bedtime., Disp: , Rfl:  .  Zinc 50 MG TABS, Take 1 tablet by mouth daily., Disp: , Rfl:   Allergies  Allergen Reactions  . Adhesive [Tape] Rash    Objective:   There were no vitals taken for this visit.  Patient is well-developed, well-nourished in no acute distress.  Resting comfortably at home.  Head is normocephalic, atraumatic.  No labored breathing.  Speech  is clear and coherent with logical content.  Patient is alert and oriented at baseline.   Assessment and Plan:   1. Post-COVID syndrome 2. Acute non-recurrent maxillary sinusitis Mild post viral cough but with concern of a secondary sinusitis. Will have him keep well-hydrated and get plenty of rest. Will add on promethazine-DM and Augmentin. Continue saline nasal rinse, humidifier and rest. Follow-up PRN.     Piedad Climes, PA-C 01/19/2020

## 2020-01-19 NOTE — Telephone Encounter (Signed)
He would need an appointment

## 2020-01-19 NOTE — Progress Notes (Signed)
I have discussed the procedure for the virtual visit with the patient who has given consent to proceed with assessment and treatment.   Solangel Mcmanaway S Daley Gosse, CMA     

## 2020-04-04 DIAGNOSIS — M25512 Pain in left shoulder: Secondary | ICD-10-CM | POA: Diagnosis not present

## 2020-04-05 DIAGNOSIS — M9907 Segmental and somatic dysfunction of upper extremity: Secondary | ICD-10-CM | POA: Diagnosis not present

## 2020-04-05 DIAGNOSIS — M9902 Segmental and somatic dysfunction of thoracic region: Secondary | ICD-10-CM | POA: Diagnosis not present

## 2020-04-05 DIAGNOSIS — M9901 Segmental and somatic dysfunction of cervical region: Secondary | ICD-10-CM | POA: Diagnosis not present

## 2020-04-05 DIAGNOSIS — M9903 Segmental and somatic dysfunction of lumbar region: Secondary | ICD-10-CM | POA: Diagnosis not present

## 2020-04-12 DIAGNOSIS — M25512 Pain in left shoulder: Secondary | ICD-10-CM | POA: Diagnosis not present

## 2020-04-13 DIAGNOSIS — M9902 Segmental and somatic dysfunction of thoracic region: Secondary | ICD-10-CM | POA: Diagnosis not present

## 2020-04-13 DIAGNOSIS — N5201 Erectile dysfunction due to arterial insufficiency: Secondary | ICD-10-CM | POA: Diagnosis not present

## 2020-04-13 DIAGNOSIS — M9901 Segmental and somatic dysfunction of cervical region: Secondary | ICD-10-CM | POA: Diagnosis not present

## 2020-04-13 DIAGNOSIS — M9907 Segmental and somatic dysfunction of upper extremity: Secondary | ICD-10-CM | POA: Diagnosis not present

## 2020-04-13 DIAGNOSIS — M9903 Segmental and somatic dysfunction of lumbar region: Secondary | ICD-10-CM | POA: Diagnosis not present

## 2020-04-13 DIAGNOSIS — N400 Enlarged prostate without lower urinary tract symptoms: Secondary | ICD-10-CM | POA: Diagnosis not present

## 2020-04-18 DIAGNOSIS — M9901 Segmental and somatic dysfunction of cervical region: Secondary | ICD-10-CM | POA: Diagnosis not present

## 2020-04-18 DIAGNOSIS — M9903 Segmental and somatic dysfunction of lumbar region: Secondary | ICD-10-CM | POA: Diagnosis not present

## 2020-04-18 DIAGNOSIS — M9902 Segmental and somatic dysfunction of thoracic region: Secondary | ICD-10-CM | POA: Diagnosis not present

## 2020-04-18 DIAGNOSIS — M9907 Segmental and somatic dysfunction of upper extremity: Secondary | ICD-10-CM | POA: Diagnosis not present

## 2020-04-25 DIAGNOSIS — M9903 Segmental and somatic dysfunction of lumbar region: Secondary | ICD-10-CM | POA: Diagnosis not present

## 2020-04-25 DIAGNOSIS — M9902 Segmental and somatic dysfunction of thoracic region: Secondary | ICD-10-CM | POA: Diagnosis not present

## 2020-04-25 DIAGNOSIS — M9907 Segmental and somatic dysfunction of upper extremity: Secondary | ICD-10-CM | POA: Diagnosis not present

## 2020-04-25 DIAGNOSIS — M9901 Segmental and somatic dysfunction of cervical region: Secondary | ICD-10-CM | POA: Diagnosis not present

## 2020-04-27 DIAGNOSIS — M25512 Pain in left shoulder: Secondary | ICD-10-CM | POA: Diagnosis not present

## 2020-05-02 DIAGNOSIS — M9901 Segmental and somatic dysfunction of cervical region: Secondary | ICD-10-CM | POA: Diagnosis not present

## 2020-05-02 DIAGNOSIS — M9903 Segmental and somatic dysfunction of lumbar region: Secondary | ICD-10-CM | POA: Diagnosis not present

## 2020-05-02 DIAGNOSIS — M9902 Segmental and somatic dysfunction of thoracic region: Secondary | ICD-10-CM | POA: Diagnosis not present

## 2020-05-02 DIAGNOSIS — M9907 Segmental and somatic dysfunction of upper extremity: Secondary | ICD-10-CM | POA: Diagnosis not present

## 2020-05-11 DIAGNOSIS — M542 Cervicalgia: Secondary | ICD-10-CM | POA: Diagnosis not present

## 2020-05-11 DIAGNOSIS — M25512 Pain in left shoulder: Secondary | ICD-10-CM | POA: Diagnosis not present

## 2020-05-11 DIAGNOSIS — M25511 Pain in right shoulder: Secondary | ICD-10-CM | POA: Diagnosis not present

## 2020-05-13 DIAGNOSIS — E291 Testicular hypofunction: Secondary | ICD-10-CM | POA: Diagnosis not present

## 2020-05-13 DIAGNOSIS — R5381 Other malaise: Secondary | ICD-10-CM | POA: Diagnosis not present

## 2020-05-13 DIAGNOSIS — K760 Fatty (change of) liver, not elsewhere classified: Secondary | ICD-10-CM | POA: Diagnosis not present

## 2020-05-13 DIAGNOSIS — R7309 Other abnormal glucose: Secondary | ICD-10-CM | POA: Diagnosis not present

## 2020-05-16 DIAGNOSIS — M9903 Segmental and somatic dysfunction of lumbar region: Secondary | ICD-10-CM | POA: Diagnosis not present

## 2020-05-16 DIAGNOSIS — M9901 Segmental and somatic dysfunction of cervical region: Secondary | ICD-10-CM | POA: Diagnosis not present

## 2020-05-16 DIAGNOSIS — M9902 Segmental and somatic dysfunction of thoracic region: Secondary | ICD-10-CM | POA: Diagnosis not present

## 2020-05-16 DIAGNOSIS — M9907 Segmental and somatic dysfunction of upper extremity: Secondary | ICD-10-CM | POA: Diagnosis not present

## 2020-05-20 DIAGNOSIS — M25511 Pain in right shoulder: Secondary | ICD-10-CM | POA: Diagnosis not present

## 2020-05-20 DIAGNOSIS — M542 Cervicalgia: Secondary | ICD-10-CM | POA: Diagnosis not present

## 2020-05-20 DIAGNOSIS — M25512 Pain in left shoulder: Secondary | ICD-10-CM | POA: Diagnosis not present

## 2020-05-31 DIAGNOSIS — N400 Enlarged prostate without lower urinary tract symptoms: Secondary | ICD-10-CM | POA: Diagnosis not present

## 2020-05-31 DIAGNOSIS — J309 Allergic rhinitis, unspecified: Secondary | ICD-10-CM | POA: Diagnosis not present

## 2020-05-31 DIAGNOSIS — J069 Acute upper respiratory infection, unspecified: Secondary | ICD-10-CM | POA: Diagnosis not present

## 2020-05-31 DIAGNOSIS — R5383 Other fatigue: Secondary | ICD-10-CM | POA: Diagnosis not present

## 2020-06-01 DIAGNOSIS — M9901 Segmental and somatic dysfunction of cervical region: Secondary | ICD-10-CM | POA: Diagnosis not present

## 2020-06-01 DIAGNOSIS — M9907 Segmental and somatic dysfunction of upper extremity: Secondary | ICD-10-CM | POA: Diagnosis not present

## 2020-06-01 DIAGNOSIS — M9902 Segmental and somatic dysfunction of thoracic region: Secondary | ICD-10-CM | POA: Diagnosis not present

## 2020-06-01 DIAGNOSIS — M9903 Segmental and somatic dysfunction of lumbar region: Secondary | ICD-10-CM | POA: Diagnosis not present

## 2020-06-01 DIAGNOSIS — M7502 Adhesive capsulitis of left shoulder: Secondary | ICD-10-CM | POA: Diagnosis not present

## 2020-06-06 DIAGNOSIS — H43813 Vitreous degeneration, bilateral: Secondary | ICD-10-CM | POA: Diagnosis not present

## 2020-06-06 DIAGNOSIS — H40013 Open angle with borderline findings, low risk, bilateral: Secondary | ICD-10-CM | POA: Diagnosis not present

## 2020-09-06 DIAGNOSIS — J019 Acute sinusitis, unspecified: Secondary | ICD-10-CM | POA: Diagnosis not present

## 2020-09-06 DIAGNOSIS — R059 Cough, unspecified: Secondary | ICD-10-CM | POA: Diagnosis not present

## 2020-09-06 DIAGNOSIS — J40 Bronchitis, not specified as acute or chronic: Secondary | ICD-10-CM | POA: Diagnosis not present

## 2020-09-06 DIAGNOSIS — H66002 Acute suppurative otitis media without spontaneous rupture of ear drum, left ear: Secondary | ICD-10-CM | POA: Diagnosis not present

## 2020-09-14 DIAGNOSIS — N400 Enlarged prostate without lower urinary tract symptoms: Secondary | ICD-10-CM | POA: Diagnosis not present

## 2020-09-14 DIAGNOSIS — E291 Testicular hypofunction: Secondary | ICD-10-CM | POA: Diagnosis not present

## 2020-09-14 DIAGNOSIS — R5381 Other malaise: Secondary | ICD-10-CM | POA: Diagnosis not present

## 2020-09-14 DIAGNOSIS — R7309 Other abnormal glucose: Secondary | ICD-10-CM | POA: Diagnosis not present

## 2020-09-27 DIAGNOSIS — J309 Allergic rhinitis, unspecified: Secondary | ICD-10-CM | POA: Diagnosis not present

## 2020-09-27 DIAGNOSIS — N400 Enlarged prostate without lower urinary tract symptoms: Secondary | ICD-10-CM | POA: Diagnosis not present

## 2020-09-27 DIAGNOSIS — R5383 Other fatigue: Secondary | ICD-10-CM | POA: Diagnosis not present

## 2020-09-27 DIAGNOSIS — J069 Acute upper respiratory infection, unspecified: Secondary | ICD-10-CM | POA: Diagnosis not present

## 2020-10-04 ENCOUNTER — Other Ambulatory Visit: Payer: Self-pay | Admitting: Allergy

## 2020-10-11 DIAGNOSIS — M25511 Pain in right shoulder: Secondary | ICD-10-CM | POA: Diagnosis not present

## 2020-10-11 DIAGNOSIS — M25512 Pain in left shoulder: Secondary | ICD-10-CM | POA: Diagnosis not present

## 2020-10-11 DIAGNOSIS — M542 Cervicalgia: Secondary | ICD-10-CM | POA: Diagnosis not present

## 2020-10-12 DIAGNOSIS — M542 Cervicalgia: Secondary | ICD-10-CM | POA: Diagnosis not present

## 2020-10-12 DIAGNOSIS — M25512 Pain in left shoulder: Secondary | ICD-10-CM | POA: Diagnosis not present

## 2020-10-12 DIAGNOSIS — M25511 Pain in right shoulder: Secondary | ICD-10-CM | POA: Diagnosis not present

## 2020-10-14 DIAGNOSIS — M25511 Pain in right shoulder: Secondary | ICD-10-CM | POA: Diagnosis not present

## 2020-10-14 DIAGNOSIS — M542 Cervicalgia: Secondary | ICD-10-CM | POA: Diagnosis not present

## 2020-10-14 DIAGNOSIS — M25512 Pain in left shoulder: Secondary | ICD-10-CM | POA: Diagnosis not present

## 2020-10-17 DIAGNOSIS — M542 Cervicalgia: Secondary | ICD-10-CM | POA: Diagnosis not present

## 2020-10-17 DIAGNOSIS — M25511 Pain in right shoulder: Secondary | ICD-10-CM | POA: Diagnosis not present

## 2020-10-17 DIAGNOSIS — M25512 Pain in left shoulder: Secondary | ICD-10-CM | POA: Diagnosis not present

## 2020-10-18 DIAGNOSIS — M542 Cervicalgia: Secondary | ICD-10-CM | POA: Diagnosis not present

## 2020-10-18 DIAGNOSIS — M25512 Pain in left shoulder: Secondary | ICD-10-CM | POA: Diagnosis not present

## 2020-10-18 DIAGNOSIS — M25511 Pain in right shoulder: Secondary | ICD-10-CM | POA: Diagnosis not present

## 2020-10-19 DIAGNOSIS — M25511 Pain in right shoulder: Secondary | ICD-10-CM | POA: Diagnosis not present

## 2020-10-19 DIAGNOSIS — M542 Cervicalgia: Secondary | ICD-10-CM | POA: Diagnosis not present

## 2020-10-19 DIAGNOSIS — M25512 Pain in left shoulder: Secondary | ICD-10-CM | POA: Diagnosis not present

## 2020-10-19 IMAGING — MG DIGITAL DIAGNOSTIC BILATERAL MAMMOGRAM WITH TOMO AND CAD
6 of 12 series · 6 of 36 positions shown · non-contrast
Comparison: Previous exam(s).

ACR Breast Density Category a: The breast tissue is almost entirely
fatty.

CLINICAL DATA: Patient presents for bilateral diagnostic
examination due to a palpable abnormality for 1-2 years over the
upper slightly outer right breast which she states is getting bigger
recently. Previous history of benign left breast biopsy 8343
demonstrating fat necrosis.

EXAM:
DIGITAL DIAGNOSTIC bilateral MAMMOGRAM WITH CAD AND TOMO
ULTRASOUND right BREAST

[L CC synth-2D (1 of 2)]
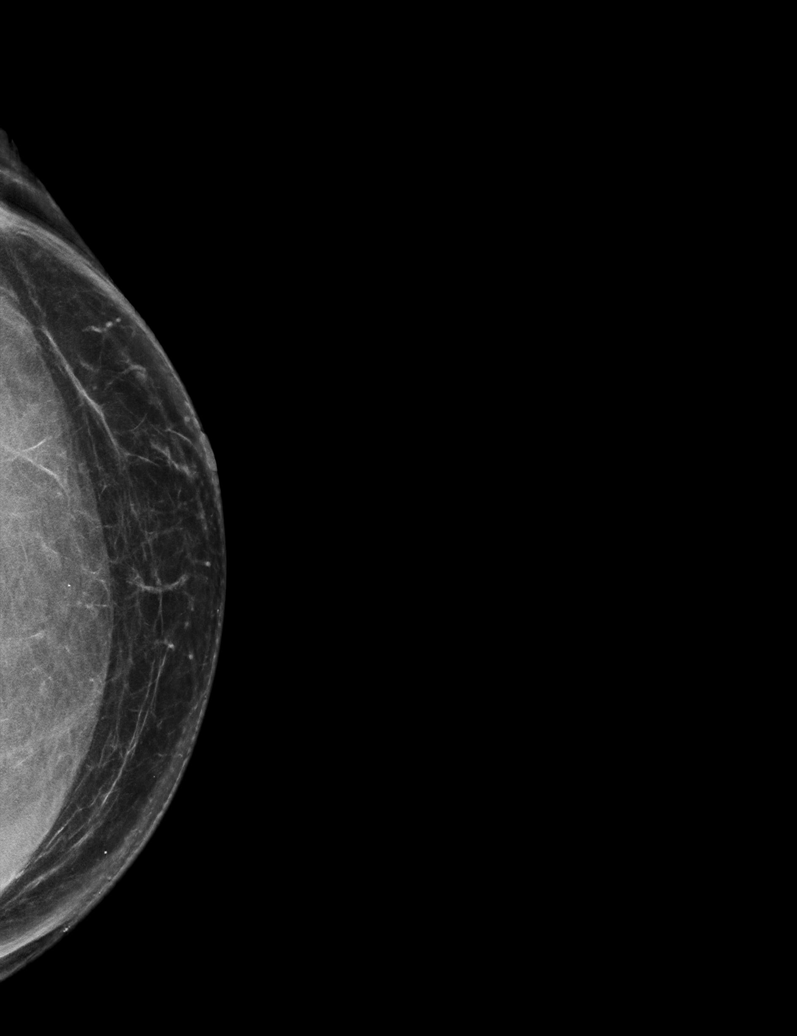

[L CC synth-2D (2 of 2)]
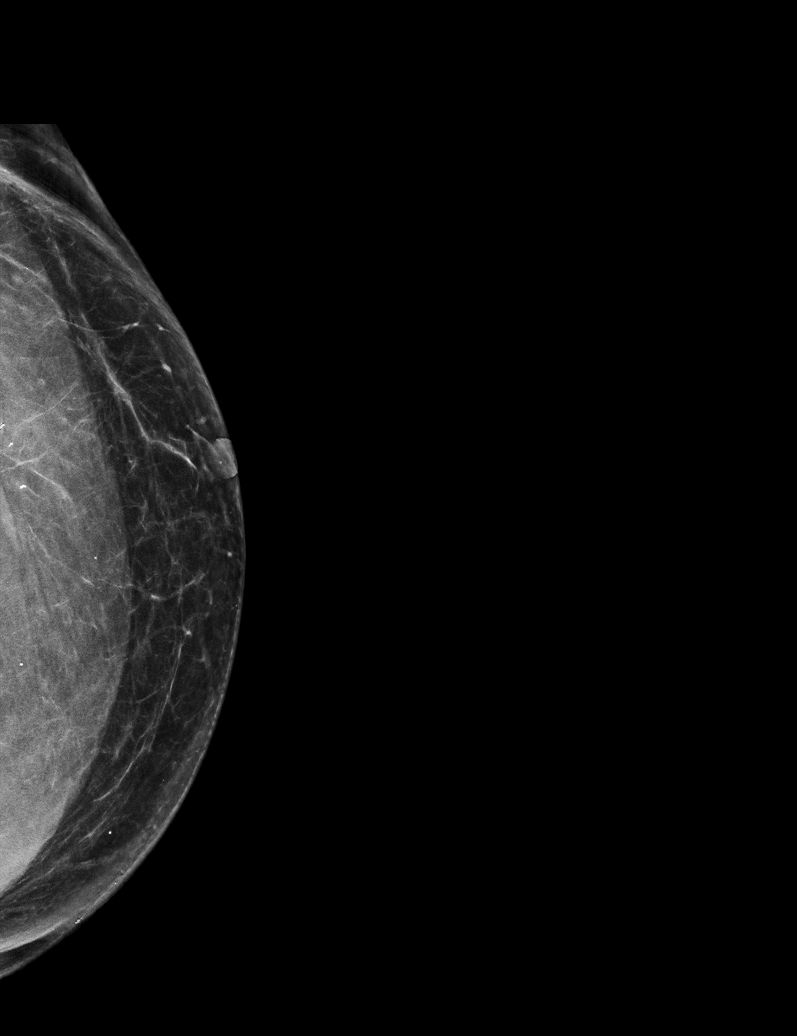

[R MLO synth-2D (1 of 2)]
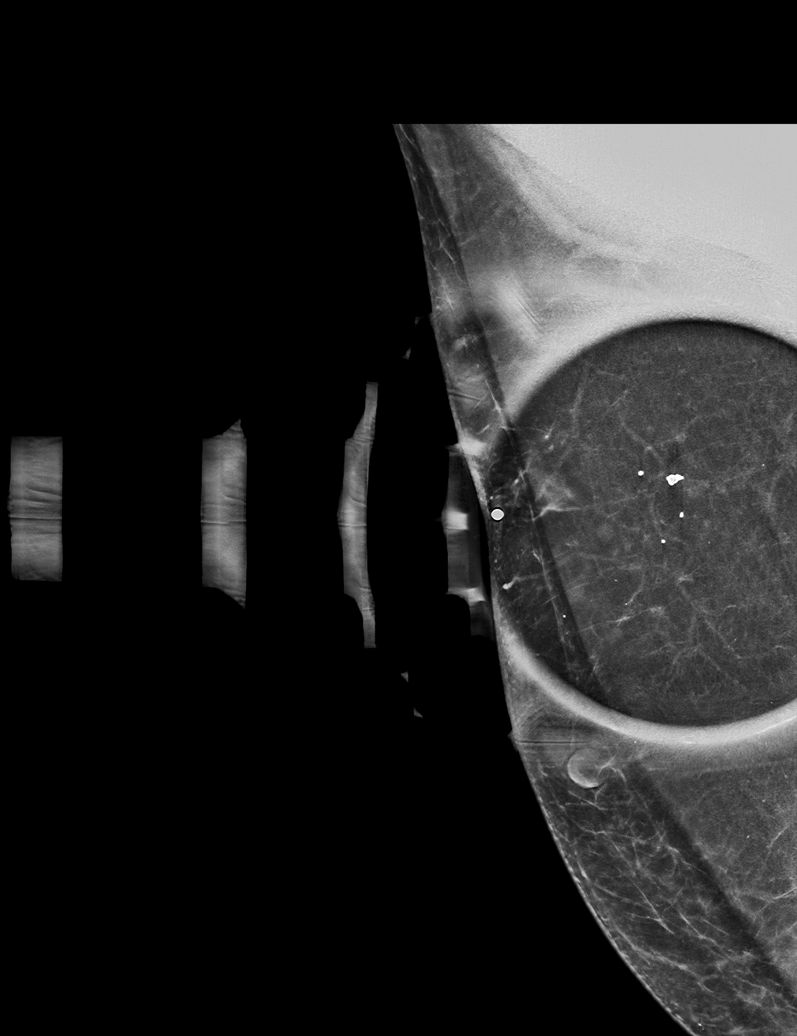

[R CC synth-2D]
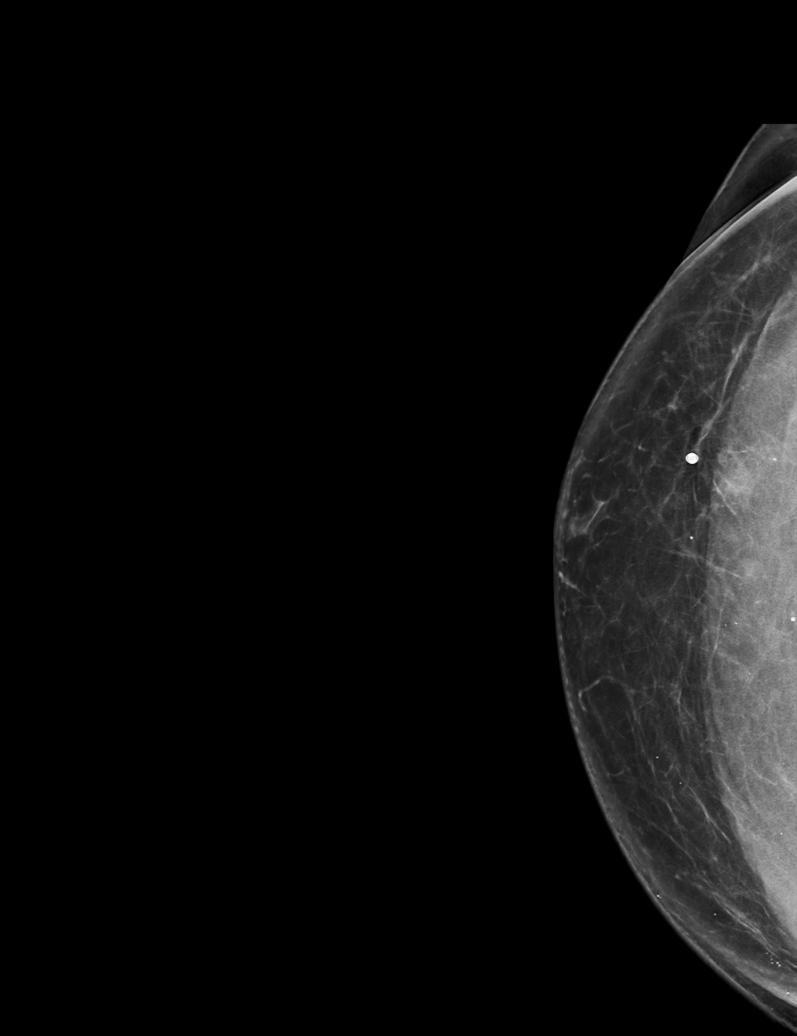

[L MLO synth-2D]
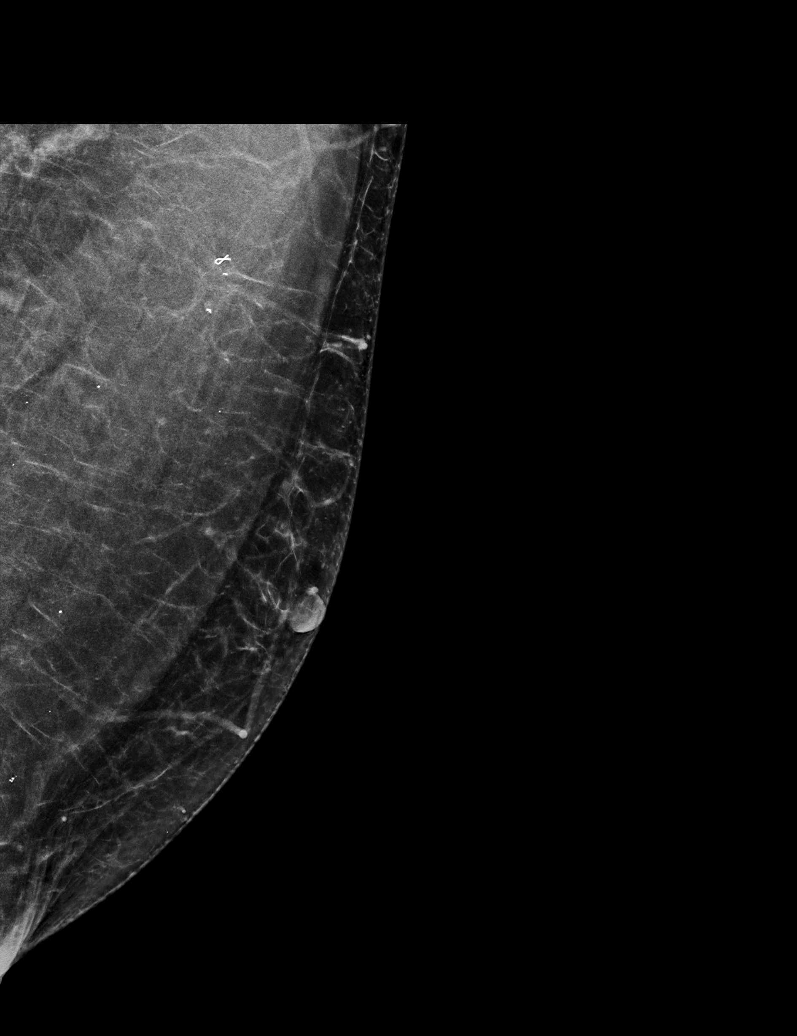

[R MLO synth-2D (2 of 2)]
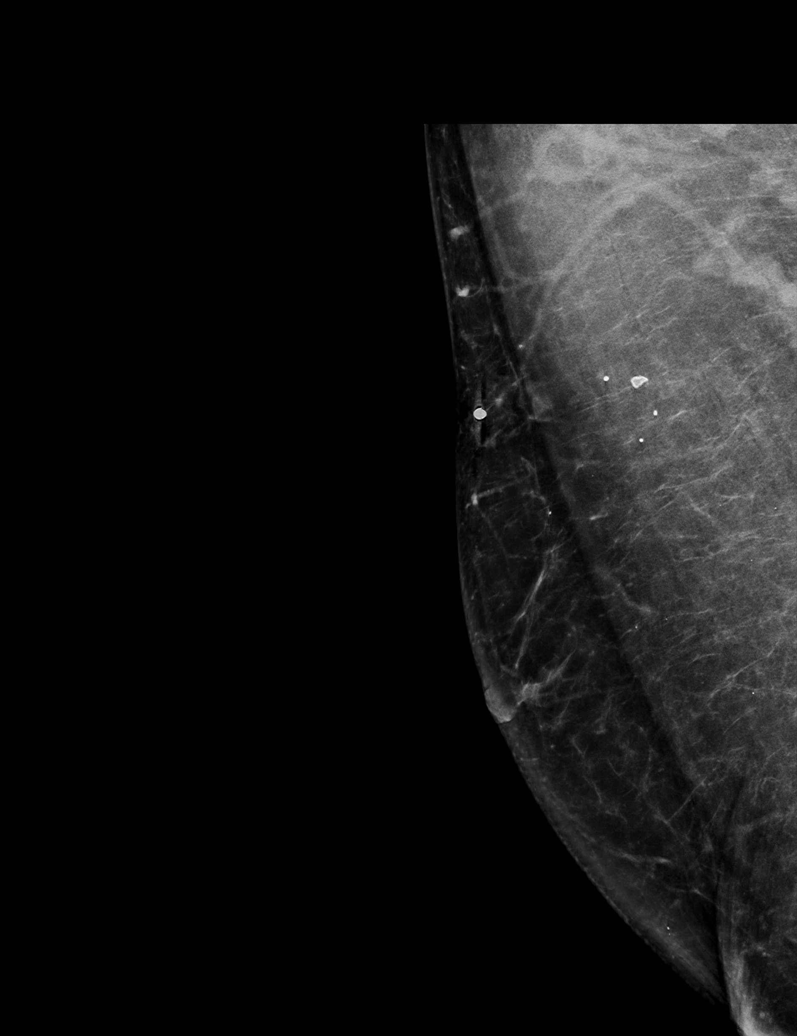

[6 of 36 positions shown; findings below may reference images not displayed]

FINDINGS: Post biopsy metallic clip over the upper left breast. Remainder of
the left breast is unchanged. Focal superficial fibrofatty density
over the slightly outer upper right breast corresponding to
patient's palpable area. There are several oil cysts and benign
calcifications extending linearly from this area typical of fat
necrosis. The palpable fibrofatty density is likely due to focal fat
necrosis. Remainder of the right breast is unchanged.

Mammographic images were processed with CAD.

On physical exam, I palpate a 1.5 cm superficial mass over the [DATE]
position of the right breast 8 cm from the nipple. No overlying skin
discoloration.

Targeted ultrasound is performed, showing ill-defined hyperechoic
area just below the skin at the [DATE] position of the right breast 8
cm from the nipple corresponding to patient's palpable abnormality
and mammographic finding. There is mild rounded central decreased
echogenicity as well as a few adjacent small oil cysts correlating
to the mammographic findings as this likely represents fat necrosis.
The predominant hyperechoic area measures 7 x 9 x 10 mm.

Ultrasound of the right axilla is normal.
IMPRESSION: Probable fat necrosis over the [DATE] position of the right breast 8
cm from the nipple measuring 7 x 9 x 10 mm.

RECOMMENDATION:
Management alternatives were discussed including imaging
surveillance versus ultrasound-guided core biopsy as patient wishes
to proceed with ultrasound-guided core needle biopsy.

I have discussed the findings and recommendations with the patient.
Results were also provided in writing at the conclusion of the
visit. If applicable, a reminder letter will be sent to the patient
regarding the next appointment.

BI-RADS CATEGORY  3: Probably benign.

Biopsy will be scheduled here at the [REDACTED]
prior to patient's departure.

## 2020-10-20 DIAGNOSIS — M542 Cervicalgia: Secondary | ICD-10-CM | POA: Diagnosis not present

## 2020-10-20 DIAGNOSIS — M25512 Pain in left shoulder: Secondary | ICD-10-CM | POA: Diagnosis not present

## 2020-10-20 DIAGNOSIS — M25511 Pain in right shoulder: Secondary | ICD-10-CM | POA: Diagnosis not present

## 2020-10-21 DIAGNOSIS — M25512 Pain in left shoulder: Secondary | ICD-10-CM | POA: Diagnosis not present

## 2020-10-21 DIAGNOSIS — M542 Cervicalgia: Secondary | ICD-10-CM | POA: Diagnosis not present

## 2020-10-21 DIAGNOSIS — M25511 Pain in right shoulder: Secondary | ICD-10-CM | POA: Diagnosis not present

## 2020-10-24 DIAGNOSIS — M25512 Pain in left shoulder: Secondary | ICD-10-CM | POA: Diagnosis not present

## 2020-10-24 DIAGNOSIS — M25511 Pain in right shoulder: Secondary | ICD-10-CM | POA: Diagnosis not present

## 2020-10-24 DIAGNOSIS — M542 Cervicalgia: Secondary | ICD-10-CM | POA: Diagnosis not present

## 2020-10-26 DIAGNOSIS — M542 Cervicalgia: Secondary | ICD-10-CM | POA: Diagnosis not present

## 2020-10-26 DIAGNOSIS — M25511 Pain in right shoulder: Secondary | ICD-10-CM | POA: Diagnosis not present

## 2020-10-26 DIAGNOSIS — M25512 Pain in left shoulder: Secondary | ICD-10-CM | POA: Diagnosis not present

## 2020-10-28 DIAGNOSIS — M25511 Pain in right shoulder: Secondary | ICD-10-CM | POA: Diagnosis not present

## 2020-10-28 DIAGNOSIS — M542 Cervicalgia: Secondary | ICD-10-CM | POA: Diagnosis not present

## 2020-10-28 DIAGNOSIS — M25512 Pain in left shoulder: Secondary | ICD-10-CM | POA: Diagnosis not present

## 2020-10-31 DIAGNOSIS — M5412 Radiculopathy, cervical region: Secondary | ICD-10-CM | POA: Diagnosis not present

## 2020-10-31 DIAGNOSIS — M25512 Pain in left shoulder: Secondary | ICD-10-CM | POA: Diagnosis not present

## 2020-10-31 DIAGNOSIS — M25511 Pain in right shoulder: Secondary | ICD-10-CM | POA: Diagnosis not present

## 2020-10-31 DIAGNOSIS — M542 Cervicalgia: Secondary | ICD-10-CM | POA: Diagnosis not present

## 2020-11-01 ENCOUNTER — Other Ambulatory Visit: Payer: Self-pay | Admitting: Orthopedic Surgery

## 2020-11-01 DIAGNOSIS — M5412 Radiculopathy, cervical region: Secondary | ICD-10-CM

## 2020-11-01 DIAGNOSIS — M542 Cervicalgia: Secondary | ICD-10-CM

## 2020-11-06 ENCOUNTER — Ambulatory Visit
Admission: RE | Admit: 2020-11-06 | Discharge: 2020-11-06 | Disposition: A | Discharge status: Home / Self Care | Payer: BC Managed Care – PPO | Source: Ambulatory Visit | Attending: Orthopedic Surgery | Admitting: Orthopedic Surgery

## 2020-11-06 DIAGNOSIS — M5412 Radiculopathy, cervical region: Secondary | ICD-10-CM

## 2020-11-06 DIAGNOSIS — M47812 Spondylosis without myelopathy or radiculopathy, cervical region: Secondary | ICD-10-CM | POA: Diagnosis not present

## 2020-11-06 DIAGNOSIS — M542 Cervicalgia: Secondary | ICD-10-CM

## 2020-11-06 DIAGNOSIS — M50223 Other cervical disc displacement at C6-C7 level: Secondary | ICD-10-CM | POA: Diagnosis not present

## 2020-11-08 DIAGNOSIS — M25511 Pain in right shoulder: Secondary | ICD-10-CM | POA: Diagnosis not present

## 2020-11-08 DIAGNOSIS — M542 Cervicalgia: Secondary | ICD-10-CM | POA: Diagnosis not present

## 2020-11-08 DIAGNOSIS — M25512 Pain in left shoulder: Secondary | ICD-10-CM | POA: Diagnosis not present

## 2020-11-11 DIAGNOSIS — M5412 Radiculopathy, cervical region: Secondary | ICD-10-CM | POA: Diagnosis not present

## 2020-11-14 DIAGNOSIS — M25511 Pain in right shoulder: Secondary | ICD-10-CM | POA: Diagnosis not present

## 2020-11-14 DIAGNOSIS — H903 Sensorineural hearing loss, bilateral: Secondary | ICD-10-CM | POA: Diagnosis not present

## 2020-11-14 DIAGNOSIS — M25512 Pain in left shoulder: Secondary | ICD-10-CM | POA: Diagnosis not present

## 2020-11-14 DIAGNOSIS — M542 Cervicalgia: Secondary | ICD-10-CM | POA: Diagnosis not present

## 2020-11-15 ENCOUNTER — Other Ambulatory Visit: Payer: Self-pay | Admitting: Orthopedic Surgery

## 2020-11-20 ENCOUNTER — Other Ambulatory Visit: Payer: BC Managed Care – PPO

## 2020-11-21 NOTE — Pre-Procedure Instructions (Signed)
Surgical Instructions    Your procedure is scheduled on Wednesday 11/30/20.   Report to [facility] Main Entrance "A" at 07:30 A.M., then  check in with the Admitting office.  Call this number if you have problems the morning of surgery:  [phone]   If you have any questions prior to your surgery date call [phone]: Open Monday-Friday 8am-4pm    Remember:  Do not eat after midnight the night before your surgery  You may drink clear liquids until 06:30 A.M. the morning of your surgery.   Clear liquids allowed are: Water, Non-Citrus Juices (without pulp), Carbonated Beverages, Clear Tea, Black Coffee ONLY (NO MILK, CREAM OR POWDERED CREAMER of any kind), and Gatorade  Patient Instructions  The night before surgery:  No food after midnight. ONLY clear liquids after midnight  The day of surgery (if you do NOT have diabetes):  Drink ONE (1) Pre-Surgery Clear Ensure by 06:30 A.M. the morning of surgery. Drink in one sitting. Do not sip.  This drink was given to you during your hospital  pre-op appointment visit.  Nothing else to drink after completing the  Pre-Surgery Clear Ensure.         If you have questions, please contact your surgeon's office.     Take these medicines the morning of surgery with A SIP OF WATER   fluticasone (FLONASE)  Propylene Glycol (SYSTANE BALANCE)    Take these medicines if needed:   Azelastine HCl 0.15 % SOLN  EPIPEN   As of today, STOP taking any Aspirin (unless otherwise instructed by your surgeon) Aleve, Naproxen, Ibuprofen, Motrin, Advil, Goody's, BC's, all herbal medications, fish oil, and all vitamins.     After your COVID test   You are not required to quarantine however you are required to wear a well-fitting mask when you are out and around people not in your household.  If your mask becomes wet or soiled, replace with a new one.  Wash your hands often with soap and water for 20 seconds or clean your hands with an  alcohol-based hand sanitizer that contains at least 60% alcohol.  Do not share personal items.  Notify your provider: if you are in close contact with someone who has COVID  or if you develop a fever of 100.4 or greater, sneezing, cough, sore throat, shortness of breath or body aches.             Do not wear jewelry or makeup Do not wear lotions, powders, perfumes/colognes, or deodorant. Do not shave 48 hours prior to surgery.  Men may shave face and neck. Do not bring valuables to the hospital. DO Not wear nail polish, gel polish, artificial nails, or any other type of covering on natural nails including finger and toenails. If patients have artificial nails, gel coating, etc. that need to be removed by a nail salon, please have this removed prior to surgery or surgery may need to be canceled/delayed if the surgeon/ anesthesia feels like the patient is unable to be adequately monitored.             [facility] is not responsible for any belongings or valuables.  Do NOT Smoke (Tobacco/Vaping)  24 hours prior to your procedure  If you use a CPAP at night, you may bring your mask for your overnight stay.   Contacts, glasses, hearing aids, dentures or partials may not be worn into surgery, please bring cases for these belongings   For patients admitted to the hospital,  discharge time will be determined by your treatment team.   Patients discharged the day of surgery will not be allowed to drive home, and someone needs to stay with them for 24 hours.  NO VISITORS WILL BE ALLOWED IN PRE-OP WHERE PATIENTS ARE PREPPED FOR SURGERY.  ONLY 1 SUPPORT PERSON MAY BE PRESENT IN THE WAITING ROOM WHILE YOU ARE IN SURGERY.  IF YOU ARE TO BE ADMITTED, ONCE YOU ARE IN YOUR ROOM YOU WILL BE ALLOWED TWO (2) VISITORS. 1 (ONE) VISITOR MAY STAY OVERNIGHT BUT MUST ARRIVE TO THE ROOM BY 8pm.  Minor children may have two parents present. Special consideration for safety and communication needs will be reviewed on  a case by case basis.  Special instructions:    Oral Hygiene is also important to reduce your risk of infection.  Remember - BRUSH YOUR TEETH THE MORNING OF SURGERY WITH YOUR REGULAR TOOTHPASTE   [facility]- Preparing For Surgery  Before surgery, you can play an important role. Because skin is not sterile, your skin needs to be as free of germs as possible. You can reduce the number of germs on your skin by washing with CHG (chlorahexidine gluconate) Soap before surgery.  CHG is an antiseptic cleaner which kills germs and bonds with the skin to continue killing germs even after washing.     Please do not use if you have an allergy to CHG or antibacterial soaps. If your skin becomes reddened/irritated stop using the CHG.  Do not shave (including legs and underarms) for at least 48 hours prior to first CHG shower. It is OK to shave your face.  Please follow these instructions carefully.     Shower the NIGHT BEFORE SURGERY and the MORNING OF SURGERY with CHG Soap.   If you chose to wash your hair, wash your hair first as usual with your normal shampoo. After you shampoo, rinse your hair and body thoroughly to remove the shampoo.  Then [employer] and genitals (private parts) with your normal soap and rinse thoroughly to remove soap.  After that Use CHG Soap as you would any other liquid soap. You can apply CHG directly to the skin and wash gently with a scrungie or a clean washcloth.   Apply the CHG Soap to your body ONLY FROM THE NECK DOWN.  Do not use on open wounds or open sores. Avoid contact with your eyes, ears, mouth and genitals (private parts). Wash Face and genitals (private parts)  with your normal soap.   Wash thoroughly, paying special attention to the area where your surgery will be performed.  Thoroughly rinse your body with warm water from the neck down.  DO NOT shower/wash with your normal soap after using and rinsing off the CHG Soap.  Pat yourself dry with a CLEAN  TOWEL.  Wear CLEAN PAJAMAS to bed the night before surgery  Place CLEAN SHEETS on your bed the night before your surgery  DO NOT SLEEP WITH PETS.   Day of Surgery:  Take a shower with CHG soap. Wear Clean/Comfortable clothing the morning of surgery Do not apply any deodorants/lotions.   Remember to brush your teeth WITH YOUR REGULAR TOOTHPASTE.   Please read over the following fact sheets that you were given.

## 2020-11-22 ENCOUNTER — Encounter (HOSPITAL_COMMUNITY): Payer: Self-pay

## 2020-11-22 ENCOUNTER — Other Ambulatory Visit: Payer: Self-pay

## 2020-11-22 ENCOUNTER — Encounter (HOSPITAL_COMMUNITY)
Admission: RE | Admit: 2020-11-22 | Discharge: 2020-11-22 | Disposition: A | Discharge status: Home / Self Care | Payer: BC Managed Care – PPO | Source: Ambulatory Visit | Attending: Orthopedic Surgery | Admitting: Orthopedic Surgery

## 2020-11-22 VITALS — BP 149/75 | HR 81 | Temp 98.4°F | Resp 18 | Ht 70.0 in | Wt 199.0 lb

## 2020-11-22 DIAGNOSIS — Z01818 Encounter for other preprocedural examination: Secondary | ICD-10-CM | POA: Diagnosis not present

## 2020-11-22 DIAGNOSIS — M25512 Pain in left shoulder: Secondary | ICD-10-CM | POA: Diagnosis not present

## 2020-11-22 DIAGNOSIS — M25511 Pain in right shoulder: Secondary | ICD-10-CM | POA: Diagnosis not present

## 2020-11-22 DIAGNOSIS — M542 Cervicalgia: Secondary | ICD-10-CM | POA: Diagnosis not present

## 2020-11-22 DIAGNOSIS — M5412 Radiculopathy, cervical region: Secondary | ICD-10-CM | POA: Diagnosis not present

## 2020-11-22 HISTORY — DX: Unspecified asthma, uncomplicated: J45.909

## 2020-11-22 HISTORY — DX: Myoneural disorder, unspecified: G70.9

## 2020-11-22 HISTORY — DX: Anxiety disorder, unspecified: F41.9

## 2020-11-22 HISTORY — DX: Unspecified hearing loss, unspecified ear: H91.90

## 2020-11-22 LAB — CBC WITH DIFFERENTIAL/PLATELET
Abs Immature Granulocytes: 0.04 10*3/uL (ref 0.00–0.07)
Basophils Absolute: 0 10*3/uL (ref 0.0–0.1)
Basophils Relative: 0 %
Eosinophils Absolute: 0.1 10*3/uL (ref 0.0–0.5)
Eosinophils Relative: 2 %
HCT: 49.6 % (ref 39.0–52.0)
Hemoglobin: 16.6 g/dL (ref 13.0–17.0)
Immature Granulocytes: 1 %
Lymphocytes Relative: 29 %
Lymphs Abs: 1.4 10*3/uL (ref 0.7–4.0)
MCH: 31 pg (ref 26.0–34.0)
MCHC: 33.5 g/dL (ref 30.0–36.0)
MCV: 92.7 fL (ref 80.0–100.0)
Monocytes Absolute: 0.4 10*3/uL (ref 0.1–1.0)
Monocytes Relative: 8 %
Neutro Abs: 2.8 10*3/uL (ref 1.7–7.7)
Neutrophils Relative %: 60 %
Platelets: 170 10*3/uL (ref 150–400)
RBC: 5.35 MIL/uL (ref 4.22–5.81)
RDW: 14 % (ref 11.5–15.5)
WBC: 4.7 10*3/uL (ref 4.0–10.5)
nRBC: 0 % (ref 0.0–0.2)

## 2020-11-22 LAB — APTT: aPTT: 27 seconds (ref 24–36)

## 2020-11-22 LAB — COMPREHENSIVE METABOLIC PANEL
ALT: 22 U/L (ref 0–44)
AST: 23 U/L (ref 15–41)
Albumin: 3.9 g/dL (ref 3.5–5.0)
Alkaline Phosphatase: 47 U/L (ref 38–126)
Anion gap: 7 (ref 5–15)
BUN: 12 mg/dL (ref 8–23)
CO2: 30 mmol/L (ref 22–32)
Calcium: 8.9 mg/dL (ref 8.9–10.3)
Chloride: 103 mmol/L (ref 98–111)
Creatinine, Ser: 1.04 mg/dL (ref 0.61–1.24)
GFR, Estimated: >60 mL/min (ref >60)
Glucose, Bld: 120 mg/dL — ABNORMAL HIGH (ref 70–99)
Potassium: 4 mmol/L (ref 3.5–5.1)
Sodium: 140 mmol/L (ref 135–145)
Total Bilirubin: 0.6 mg/dL (ref 0.3–1.2)
Total Protein: 6.3 g/dL — ABNORMAL LOW (ref 6.5–8.1)

## 2020-11-22 LAB — URINALYSIS, ROUTINE W REFLEX MICROSCOPIC
Bilirubin Urine: NEGATIVE
Glucose, UA: NEGATIVE mg/dL
Hgb urine dipstick: NEGATIVE
Ketones, ur: NEGATIVE mg/dL
Leukocytes,Ua: NEGATIVE
Nitrite: NEGATIVE
Protein, ur: NEGATIVE mg/dL
Specific Gravity, Urine: 1.006 (ref 1.005–1.030)
pH: 7 (ref 5.0–8.0)

## 2020-11-22 LAB — TYPE AND SCREEN
ABO/RH(D): A POS
Antibody Screen: NEGATIVE

## 2020-11-22 LAB — SURGICAL PCR SCREEN
MRSA, PCR: NEGATIVE
Staphylococcus aureus: NEGATIVE

## 2020-11-22 LAB — PROTIME-INR
INR: 1 (ref 0.8–1.2)
Prothrombin Time: 13.5 seconds (ref 11.4–15.2)

## 2020-11-22 NOTE — Progress Notes (Addendum)
PCP - Dr. Merri Brunette Cardiologist - denies  PPM/ICD - n/a Device Orders - n/a Rep Notified - n/a  Chest x-ray - n/a EKG - 11/22/20 Stress Test - denies ECHO - 09/28/16 Cardiac Cath - denies  Sleep Study - denies CPAP - n/a  Fasting Blood Sugar - n/a Checks Blood Sugar _____ times a day- n/a  Blood Thinner Instructions: n/a Aspirin Instructions: n/a  ERAS Protcol - Yes PRE-SURGERY Ensure or G2- Ensure  COVID TEST- No. Ambulatory Surgery   Anesthesia review: Yes  Patient denies shortness of breath, fever, cough and chest pain at PAT appointment   All instructions explained to the patient, with a verbal understanding of the material. Patient agrees to go over the instructions while at home for a better understanding. The opportunity to ask questions was provided.

## 2020-11-23 DIAGNOSIS — M25511 Pain in right shoulder: Secondary | ICD-10-CM | POA: Diagnosis not present

## 2020-11-29 NOTE — Anesthesia Preprocedure Evaluation (Addendum)
Anesthesia Evaluation  Patient identified by MRN, date of birth, ID band Patient awake    Reviewed: Allergy & Precautions, NPO status , Patient's Chart, lab work & pertinent test results  Airway Mallampati: II  TM Distance: >3 FB Neck ROM: Full    Dental no notable dental hx. (+) Dental Advisory Given, Teeth Intact   Pulmonary asthma ,    Pulmonary exam normal breath sounds clear to auscultation       Cardiovascular negative cardio ROS Normal cardiovascular exam Rhythm:Regular Rate:Normal  Echo 09/2016 Normal LVSF, no significant valvulopathy    Neuro/Psych PSYCHIATRIC DISORDERS Anxiety Depression    GI/Hepatic Neg liver ROS, GERD  ,  Endo/Other  negative endocrine ROS  Renal/GU negative Renal ROS     Musculoskeletal  (+) Arthritis ,   Abdominal   Peds  Hematology negative hematology ROS (+)   Anesthesia Other Findings   Reproductive/Obstetrics                            Anesthesia Physical Anesthesia Plan  ASA: 2  Anesthesia Plan: General   Post-op Pain Management: Tylenol PO (pre-op), Ketamine IV and Dilaudid IV   Induction: Intravenous  PONV Risk Score and Plan: 3 and Ondansetron, Dexamethasone, Treatment may vary due to age or medical condition and Midazolam  Airway Management Planned: Oral ETT  Additional Equipment:   Intra-op Plan:   Post-operative Plan: Extubation in OR  Informed Consent: I have reviewed the patients History and Physical, chart, labs and discussed the procedure including the risks, benefits and alternatives for the proposed anesthesia with the patient or authorized representative who has indicated his/her understanding and acceptance.     Dental advisory given  Plan Discussed with: CRNA  Anesthesia Plan Comments:        Anesthesia Quick Evaluation

## 2020-11-30 ENCOUNTER — Ambulatory Visit (HOSPITAL_COMMUNITY): Payer: BC Managed Care – PPO

## 2020-11-30 ENCOUNTER — Ambulatory Visit (HOSPITAL_COMMUNITY)
Admission: RE | Admit: 2020-11-30 | Discharge: 2020-11-30 | Disposition: A | Discharge status: Home / Self Care | Payer: BC Managed Care – PPO | Attending: Orthopedic Surgery | Admitting: Orthopedic Surgery

## 2020-11-30 ENCOUNTER — Ambulatory Visit (HOSPITAL_COMMUNITY): Payer: BC Managed Care – PPO | Admitting: Certified Registered"

## 2020-11-30 ENCOUNTER — Ambulatory Visit (HOSPITAL_COMMUNITY): Payer: BC Managed Care – PPO | Admitting: Physician Assistant

## 2020-11-30 ENCOUNTER — Other Ambulatory Visit: Payer: Self-pay

## 2020-11-30 ENCOUNTER — Encounter (HOSPITAL_COMMUNITY): Payer: Self-pay | Admitting: Orthopedic Surgery

## 2020-11-30 ENCOUNTER — Encounter (HOSPITAL_COMMUNITY)
Admission: RE | Disposition: A | Discharge status: Home / Self Care | Payer: Self-pay | Source: Home / Self Care | Attending: Orthopedic Surgery

## 2020-11-30 DIAGNOSIS — Z419 Encounter for procedure for purposes other than remedying health state, unspecified: Secondary | ICD-10-CM

## 2020-11-30 DIAGNOSIS — M4802 Spinal stenosis, cervical region: Secondary | ICD-10-CM | POA: Insufficient documentation

## 2020-11-30 DIAGNOSIS — M4322 Fusion of spine, cervical region: Secondary | ICD-10-CM | POA: Diagnosis not present

## 2020-11-30 DIAGNOSIS — M5412 Radiculopathy, cervical region: Secondary | ICD-10-CM | POA: Diagnosis not present

## 2020-11-30 DIAGNOSIS — M50123 Cervical disc disorder at C6-C7 level with radiculopathy: Secondary | ICD-10-CM | POA: Diagnosis not present

## 2020-11-30 HISTORY — PX: ANTERIOR CERVICAL DECOMP/DISCECTOMY FUSION: SHX1161

## 2020-11-30 LAB — ABO/RH: ABO/RH(D): A POS

## 2020-11-30 SURGERY — ANTERIOR CERVICAL DECOMPRESSION/DISCECTOMY FUSION 1 LEVEL
Anesthesia: General

## 2020-11-30 MED ORDER — FENTANYL CITRATE (PF) 250 MCG/5ML IJ SOLN
INTRAMUSCULAR | Status: AC
  Filled 2020-11-30: qty 5

## 2020-11-30 MED ORDER — MIDAZOLAM HCL 2 MG/2ML IJ SOLN
INTRAMUSCULAR | Status: AC
  Filled 2020-11-30: qty 2

## 2020-11-30 MED ORDER — THROMBIN 20000 UNITS EX SOLR
CUTANEOUS | Status: AC
  Filled 2020-11-30: qty 20000

## 2020-11-30 MED ORDER — CHLORHEXIDINE GLUCONATE 0.12 % MT SOLN: 15.0000 mL | Freq: Once | OROMUCOSAL | Status: AC

## 2020-11-30 MED ORDER — ONDANSETRON HCL 4 MG/2ML IJ SOLN
INTRAMUSCULAR | Status: AC
  Filled 2020-11-30: qty 2

## 2020-11-30 MED ORDER — LACTATED RINGERS IV SOLN: INTRAVENOUS | Status: DC

## 2020-11-30 MED ORDER — ORAL CARE MOUTH RINSE: 15.0000 mL | Freq: Once | OROMUCOSAL | Status: AC

## 2020-11-30 MED ORDER — PROPOFOL 10 MG/ML IV BOLUS
INTRAVENOUS | Status: DC | PRN
  Administered 2020-11-30: 160 mg via INTRAVENOUS

## 2020-11-30 MED ORDER — LIDOCAINE 2% (20 MG/ML) 5 ML SYRINGE
INTRAMUSCULAR | Status: AC
  Filled 2020-11-30: qty 5

## 2020-11-30 MED ORDER — MEPERIDINE HCL 25 MG/ML IJ SOLN: 6.2500 mg | INTRAMUSCULAR | Status: DC | PRN

## 2020-11-30 MED ORDER — PROPOFOL 10 MG/ML IV BOLUS
INTRAVENOUS | Status: AC
  Filled 2020-11-30: qty 20

## 2020-11-30 MED ORDER — 0.9 % SODIUM CHLORIDE (POUR BTL) OPTIME
TOPICAL | Status: DC | PRN
  Administered 2020-11-30 (×2): 1000 mL

## 2020-11-30 MED ORDER — SURGIFLO WITH THROMBIN (HEMOSTATIC MATRIX KIT) OPTIME
TOPICAL | Status: DC | PRN
  Administered 2020-11-30: 1 via TOPICAL

## 2020-11-30 MED ORDER — THROMBIN 20000 UNITS EX SOLR
CUTANEOUS | Status: DC | PRN
  Administered 2020-11-30: 20000 [IU] via TOPICAL

## 2020-11-30 MED ORDER — ROCURONIUM BROMIDE 10 MG/ML (PF) SYRINGE
PREFILLED_SYRINGE | INTRAVENOUS | Status: DC | PRN
  Administered 2020-11-30: 20 mg via INTRAVENOUS
  Administered 2020-11-30: 50 mg via INTRAVENOUS

## 2020-11-30 MED ORDER — PROMETHAZINE HCL 25 MG/ML IJ SOLN
6.2500 mg | INTRAMUSCULAR | Status: DC | PRN
Start: 2020-11-30 — End: 2020-11-30

## 2020-11-30 MED ORDER — PHENYLEPHRINE 40 MCG/ML (10ML) SYRINGE FOR IV PUSH (FOR BLOOD PRESSURE SUPPORT)
PREFILLED_SYRINGE | INTRAVENOUS | Status: AC
  Filled 2020-11-30: qty 10

## 2020-11-30 MED ORDER — METHOCARBAMOL 500 MG PO TABS: 500.0000 mg | ORAL_TABLET | Freq: Four times a day (QID) | ORAL | 0 refills | Status: DC | PRN

## 2020-11-30 MED ORDER — LIDOCAINE 2% (20 MG/ML) 5 ML SYRINGE
INTRAMUSCULAR | Status: DC | PRN
  Administered 2020-11-30: 80 mg via INTRAVENOUS

## 2020-11-30 MED ORDER — CEFAZOLIN SODIUM-DEXTROSE 2-4 GM/100ML-% IV SOLN
INTRAVENOUS | Status: AC
  Filled 2020-11-30: qty 100

## 2020-11-30 MED ORDER — HYDROMORPHONE HCL 1 MG/ML IJ SOLN: 0.2500 mg | INTRAMUSCULAR | Status: DC | PRN

## 2020-11-30 MED ORDER — POVIDONE-IODINE 7.5 % EX SOLN: Freq: Once | CUTANEOUS | Status: DC

## 2020-11-30 MED ORDER — HYDROCODONE-ACETAMINOPHEN 5-325 MG PO TABS: 1.0000 | ORAL_TABLET | Freq: Four times a day (QID) | ORAL | 0 refills | Status: AC | PRN

## 2020-11-30 MED ORDER — CHLORHEXIDINE GLUCONATE 0.12 % MT SOLN
OROMUCOSAL | Status: AC
  Administered 2020-11-30: 15 mL via OROMUCOSAL
  Filled 2020-11-30: qty 15

## 2020-11-30 MED ORDER — HEMOSTATIC AGENTS (NO CHARGE) OPTIME
TOPICAL | Status: DC | PRN
  Administered 2020-11-30: 1 via TOPICAL

## 2020-11-30 MED ORDER — ONDANSETRON HCL 4 MG/2ML IJ SOLN
INTRAMUSCULAR | Status: DC | PRN
  Administered 2020-11-30: 4 mg via INTRAVENOUS

## 2020-11-30 MED ORDER — MIDAZOLAM HCL 5 MG/5ML IJ SOLN
INTRAMUSCULAR | Status: DC | PRN
  Administered 2020-11-30: 2 mg via INTRAVENOUS

## 2020-11-30 MED ORDER — SUGAMMADEX SODIUM 200 MG/2ML IV SOLN
INTRAVENOUS | Status: DC | PRN
  Administered 2020-11-30: 200 mg via INTRAVENOUS

## 2020-11-30 MED ORDER — DEXAMETHASONE SODIUM PHOSPHATE 10 MG/ML IJ SOLN
INTRAMUSCULAR | Status: AC
  Filled 2020-11-30: qty 1

## 2020-11-30 MED ORDER — ROCURONIUM BROMIDE 10 MG/ML (PF) SYRINGE
PREFILLED_SYRINGE | INTRAVENOUS | Status: AC
  Filled 2020-11-30: qty 10

## 2020-11-30 MED ORDER — BUPIVACAINE-EPINEPHRINE (PF) 0.25% -1:200000 IJ SOLN
INTRAMUSCULAR | Status: AC
  Filled 2020-11-30: qty 30

## 2020-11-30 MED ORDER — PHENYLEPHRINE HCL (PRESSORS) 10 MG/ML IV SOLN
INTRAVENOUS | Status: DC | PRN
  Administered 2020-11-30: 80 ug via INTRAVENOUS
  Administered 2020-11-30: 120 ug via INTRAVENOUS
  Administered 2020-11-30 (×5): 80 ug via INTRAVENOUS
  Administered 2020-11-30: 120 ug via INTRAVENOUS

## 2020-11-30 MED ORDER — BUPIVACAINE-EPINEPHRINE 0.25% -1:200000 IJ SOLN
INTRAMUSCULAR | Status: DC | PRN
  Administered 2020-11-30: 5 mL

## 2020-11-30 MED ORDER — ACETAMINOPHEN 500 MG PO TABS
ORAL_TABLET | ORAL | Status: AC
  Administered 2020-11-30: 1000 mg via ORAL
  Filled 2020-11-30: qty 2

## 2020-11-30 MED ORDER — CEFAZOLIN SODIUM-DEXTROSE 2-4 GM/100ML-% IV SOLN
2.0000 g | INTRAVENOUS | Status: AC
  Administered 2020-11-30: 2 g via INTRAVENOUS

## 2020-11-30 MED ORDER — ACETAMINOPHEN 500 MG PO TABS: 1000.0000 mg | ORAL_TABLET | Freq: Once | ORAL | Status: AC

## 2020-11-30 MED ORDER — FENTANYL CITRATE (PF) 100 MCG/2ML IJ SOLN
INTRAMUSCULAR | Status: DC | PRN
  Administered 2020-11-30: 100 ug via INTRAVENOUS
  Administered 2020-11-30: 50 ug via INTRAVENOUS
  Administered 2020-11-30: 100 ug via INTRAVENOUS

## 2020-11-30 MED ORDER — DEXAMETHASONE SODIUM PHOSPHATE 10 MG/ML IJ SOLN
INTRAMUSCULAR | Status: DC | PRN
  Administered 2020-11-30: 4 mg via INTRAVENOUS

## 2020-11-30 SURGICAL SUPPLY — 71 items
BAG COUNTER SPONGE SURGICOUNT (BAG) ×2 IMPLANT
BENZOIN TINCTURE PRP APPL 2/3 (GAUZE/BANDAGES/DRESSINGS) ×2 IMPLANT
BIT DRILL NEURO 2X3.1 SFT TUCH (MISCELLANEOUS) ×1 IMPLANT
BIT DRILL SRG 14X2.2XFLT CHK (BIT) ×1 IMPLANT
BIT DRL SRG 14X2.2XFLT CHK (BIT) ×1
BLADE CLIPPER SURG (BLADE) ×2 IMPLANT
BLADE SURG 15 STRL LF DISP TIS (BLADE) ×1 IMPLANT
BLADE SURG 15 STRL SS (BLADE) ×1
BONE VIVIGEN FORMABLE 1.3CC (Bone Implant) ×2 IMPLANT
CARTRIDGE OIL MAESTRO DRILL (MISCELLANEOUS) ×1 IMPLANT
CORD BIPOLAR FORCEPS 12FT (ELECTRODE) ×2 IMPLANT
COVER SURGICAL LIGHT HANDLE (MISCELLANEOUS) ×2 IMPLANT
DIFFUSER DRILL AIR PNEUMATIC (MISCELLANEOUS) ×2 IMPLANT
DRAIN JACKSON RD 7FR 3/32 (WOUND CARE) IMPLANT
DRAPE C-ARM 42X72 X-RAY (DRAPES) ×2 IMPLANT
DRAPE POUCH INSTRU U-SHP 10X18 (DRAPES) ×2 IMPLANT
DRAPE SURG 17X23 STRL (DRAPES) ×6 IMPLANT
DRILL BIT SKYLINE 14MM (BIT) ×2
DRILL NEURO 2X3.1 SOFT TOUCH (MISCELLANEOUS) ×2
DURAPREP 26ML APPLICATOR (WOUND CARE) ×2 IMPLANT
ELECT COATED BLADE 2.86 ST (ELECTRODE) ×2 IMPLANT
ELECT REM PT RETURN 9FT ADLT (ELECTROSURGICAL) ×2
ELECTRODE REM PT RTRN 9FT ADLT (ELECTROSURGICAL) ×1 IMPLANT
EVACUATOR SILICONE 100CC (DRAIN) IMPLANT
GAUZE 4X4 16PLY ~~LOC~~+RFID DBL (SPONGE) ×2 IMPLANT
GAUZE SPONGE 4X4 12PLY STRL (GAUZE/BANDAGES/DRESSINGS) ×2 IMPLANT
GLOVE SRG 8 PF TXTR STRL LF DI (GLOVE) ×1 IMPLANT
GLOVE SURG ENC MOIS LTX SZ6.5 (GLOVE) ×2 IMPLANT
GLOVE SURG ENC MOIS LTX SZ8 (GLOVE) ×2 IMPLANT
GLOVE SURG UNDER POLY LF SZ7 (GLOVE) ×4 IMPLANT
GLOVE SURG UNDER POLY LF SZ8 (GLOVE) ×1
GOWN STRL REUS W/ TWL LRG LVL3 (GOWN DISPOSABLE) ×1 IMPLANT
GOWN STRL REUS W/ TWL XL LVL3 (GOWN DISPOSABLE) ×1 IMPLANT
GOWN STRL REUS W/TWL LRG LVL3 (GOWN DISPOSABLE) ×1
GOWN STRL REUS W/TWL XL LVL3 (GOWN DISPOSABLE) ×2
INTERLOCK LRDTC CRVCL VBR 6MM (Bone Implant) ×1 IMPLANT
IV CATH 14GX2 1/4 (CATHETERS) ×2 IMPLANT
KIT BASIN OR (CUSTOM PROCEDURE TRAY) ×2 IMPLANT
KIT TURNOVER KIT B (KITS) ×2 IMPLANT
LORDOTIC CERVICAL VBR 6MM SM (Bone Implant) ×2 IMPLANT
MANIFOLD NEPTUNE II (INSTRUMENTS) ×2 IMPLANT
NEEDLE PRECISIONGLIDE 27X1.5 (NEEDLE) ×2 IMPLANT
NEEDLE SPNL 20GX3.5 QUINCKE YW (NEEDLE) ×2 IMPLANT
NS IRRIG 1000ML POUR BTL (IV SOLUTION) ×2 IMPLANT
OIL CARTRIDGE MAESTRO DRILL (MISCELLANEOUS) ×2
PACK ORTHO CERVICAL (CUSTOM PROCEDURE TRAY) ×2 IMPLANT
PAD ARMBOARD 7.5X6 YLW CONV (MISCELLANEOUS) ×4 IMPLANT
PATTIES SURGICAL .5 X.5 (GAUZE/BANDAGES/DRESSINGS) ×2 IMPLANT
PATTIES SURGICAL .5 X1 (DISPOSABLE) IMPLANT
PIN DISTRACTION 14 (PIN) ×4 IMPLANT
PLATE SKYLINE 12MM (Plate) ×2 IMPLANT
POSITIONER HEAD DONUT 9IN (MISCELLANEOUS) ×2 IMPLANT
SCREW SKYLINE VAR OS 14MM (Screw) ×8 IMPLANT
SPONGE INTESTINAL PEANUT (DISPOSABLE) ×4 IMPLANT
SPONGE SURGIFOAM ABS GEL 100 (HEMOSTASIS) ×2 IMPLANT
SPONGE T-LAP 4X18 ~~LOC~~+RFID (SPONGE) ×2 IMPLANT
STRIP CLOSURE SKIN 1/2X4 (GAUZE/BANDAGES/DRESSINGS) ×2 IMPLANT
SURGIFLO W/THROMBIN 8M KIT (HEMOSTASIS) ×2 IMPLANT
SUT MNCRL AB 4-0 PS2 18 (SUTURE) ×2 IMPLANT
SUT SILK 4 0 (SUTURE)
SUT SILK 4-0 18XBRD TIE 12 (SUTURE) IMPLANT
SUT VIC AB 2-0 CT2 18 VCP726D (SUTURE) ×2 IMPLANT
SYR BULB IRRIG 60ML STRL (SYRINGE) ×2 IMPLANT
SYR CONTROL 10ML LL (SYRINGE) ×4 IMPLANT
TAPE CLOTH 4X10 WHT NS (GAUZE/BANDAGES/DRESSINGS) ×2 IMPLANT
TAPE CLOTH SURG 6X10 WHT LF (GAUZE/BANDAGES/DRESSINGS) ×2 IMPLANT
TAPE UMBILICAL COTTON 1/8X30 (MISCELLANEOUS) ×2 IMPLANT
TOWEL GREEN STERILE (TOWEL DISPOSABLE) ×2 IMPLANT
TOWEL GREEN STERILE FF (TOWEL DISPOSABLE) ×2 IMPLANT
WATER STERILE IRR 1000ML POUR (IV SOLUTION) ×2 IMPLANT
YANKAUER SUCT BULB TIP NO VENT (SUCTIONS) ×2 IMPLANT

## 2020-11-30 NOTE — Op Note (Addendum)
PATIENT NAME: Jeremy [name]   MEDICAL RECORD NO.:   [id number]    DATE OF BIRTH: [birth year]   DATE OF PROCEDURE: 11/30/2020                                OPERATIVE REPORT     PREOPERATIVE DIAGNOSES: 1. Left-sided cervical radiculopathy. 2. Spinal stenosis, C6/7.   POSTOPERATIVE DIAGNOSES: 1. Left-sided cervical radiculopathy. 2. Spinal stenosis, C6/7.   PROCEDURE: 1. Anterior cervical decompression and fusion C6/7. 2. Placement of anterior instrumentation, C6/7. 3. Insertion of interbody device x1 (26mm Titan intervertebral spacer). 4. Intraoperative use of fluoroscopy. 5. Use of morselized allograft - ViviGen.   SURGEON:  Estill Bamberg, MD   ASSISTANT:  None   ANESTHESIA:  General endotracheal anesthesia.   COMPLICATIONS:  None.   DISPOSITION:  Stable.   ESTIMATED BLOOD LOSS:  Minimal.   INDICATIONS FOR SURGERY:  Briefly, Jeremy [name] is a pleasant 60 -year- old male, who did present to me with severe pain in his neck and left arm.   The patient's MRI did reveal the findings noted above.  Given the ongoing rather debilitating pain and lack of improvement with appropriate treatment measures, we did discuss proceeding with the procedure noted above.  The patient was fully aware of the risks and limitations of surgery as outlined in my preoperative note.   OPERATIVE DETAILS:  On 11/30/2020, the patient was brought to surgery and general endotracheal anesthesia was administered.  The patient was placed supine on the hospital bed. The neck was gently extended.  All bony prominences were meticulously padded.  The neck was prepped and draped in the usual sterile fashion.  At this point, I did make a left-sided transverse incision.  The platysma was incised.  A Smith-Robinson approach was used and the anterior spine was identified. A self-retaining retractor was placed.  I then subperiosteally exposed the vertebral bodies from C6-C7.  Caspar pins were then placed into  the C6 and C7 vertebral bodies and distraction was applied.  A thorough and complete C6-7 intervertebral diskectomy was performed.  The posterior longitudinal ligament was identified and entered using a nerve hook.  I then used #1 followed by #2 Kerrison to perform a thorough and complete intervertebral diskectomy.  The spinal canal was thoroughly decompressed, as was the left neuroforamen.  A large disc fragment was noted in the  region of the foramen on the left. The endplates were then prepared and the appropriate-sized intervertebral spacer was then packed with ViviGen and tamped into position in the usual fashion. The Caspar pins  then were removed and bone wax was placed in their place.  The appropriate-sized anterior cervical plate was placed over the anterior spine.  21mm variable angle screws were placed, 2 in each vertebral body from C6-C7 for a total of 4 vertebral body screws.  The screws were then locked to the plate using the Cam locking mechanism.  I was very pleased with the final fluoroscopic images.  The wound was then irrigated.  The wound was then explored for any undue bleeding and there was no bleeding noted. The wound was then closed in layers using 2-0 Vicryl, followed by 4-0 Monocryl.  Benzoin and Steri-Strips were applied, followed by sterile dressing.  All instrument counts were correct at the termination of the procedure.     Estill Bamberg, MD

## 2020-11-30 NOTE — Transfer of Care (Signed)
Immediate Anesthesia Transfer of Care Note  Patient: Jeremy [name]  Procedure(s) Performed: ANTERIOR CERVICAL DECOMPRESSION FUSION CERVICAL SIX- CERVICAL SEVEN WITH INSTRUMENTATION AND ALLOGRAFT  Patient Location: PACU  Anesthesia Type:General  Level of Consciousness: awake, alert  and oriented  Airway & Oxygen Therapy: Patient Spontanous Breathing  Post-op Assessment: Post -op Vital signs reviewed and stable  Post vital signs: Reviewed  Last Vitals:  Vitals Value Taken Time  BP 148/85 11/30/20 0950  Temp 36.4 C 11/30/20 0950  Pulse 89 11/30/20 0952  Resp 15 11/30/20 0952  SpO2 94 % 11/30/20 0952  Vitals shown include unvalidated device data.  Last Pain:  Vitals:   11/30/20 0605  TempSrc:   PainSc: 0-No pain         Complications: No notable events documented.

## 2020-11-30 NOTE — H&P (Signed)
PREOPERATIVE H&P  Chief Complaint: Left arm pain  HPI: Jeremy [name] is a 60 y.o. 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, compressing the left C7 nerve  Patient has failed multiple forms of conservative care and continues to have pain (see office notes for additional details regarding the patient's full course of treatment)  Past Medical History:  Diagnosis Date   Allergy    Anxiety    Claustrophobia   Asthma    Depression    GERD (gastroesophageal reflux disease)    Hearing loss    History of chickenpox    Neuromuscular disorder (HCC)    Thyroid disease    Past Surgical History:  Procedure Laterality Date   FRACTURE SURGERY     Lumbar 2 compression   KNEE SURGERY     SHOULDER SURGERY Right    Arthroscopy   SINUS SURGERY WITH INSTATRAK     TONSILLECTOMY     Social History   Socioeconomic History   Marital status: Divorced    Spouse name: Not on file   Number of children: 4   Years of education: Not on file   Highest education level: Not on file  Occupational History   Not on file  Tobacco Use   Smoking status: Never   Smokeless tobacco: Never  Vaping Use   Vaping Use: Never used  Substance and Sexual Activity   Alcohol use: Yes    Comment: Drinks hard liquor daily- 4-8 oz   Drug use: Not Currently    Types: Marijuana    Comment: Marijuana gummies   Sexual activity: Yes  Other Topics Concern   Not on file  Social History Narrative   Not on file   Social Determinants of Health   Financial Resource Strain: Not on file  Food Insecurity: Not on file  Transportation Needs: Not on file  Physical Activity: Not on file  Stress: Not on file  Social Connections: Not on file   Family History  Problem Relation Age of Onset   Cancer Mother        Metastatic Cancer Secondary   Depression Mother    Early death Mother    Miscarriages / [place] Mother    Alcohol abuse Father    COPD Father     Depression Father    Drug abuse Father    Early death Father    Heart attack Father    Heart disease Father    Hyperlipidemia Father    Hypertension Father    Intellectual disability Father    Drug abuse Son    Diabetes Maternal Grandmother    Stroke Maternal Grandfather    Diabetes Paternal Grandmother    Alcohol abuse Paternal Grandfather    Drug abuse Son    Allergies  Allergen Reactions   Adhesive [Tape] Rash   Prior to Admission medications   Medication Sig Start Date End Date Taking? Authorizing Provider  anastrozole (ARIMIDEX) 1 MG tablet Take 1 mg by mouth 2 (two) times a week.   Yes [provider]  Azelastine HCl 0.15 % SOLN USE 1 TO 2 SPRAYS INTO THE NOSE 2 TIMES DAILY AS NEEDED FOR RUNNY NOSE AND POST NASAL DRIP. 10/04/20  Yes Ellamae Sia, DO  clomiPHENE (CLOMID) 50 MG tablet Take 50 mg by mouth 2 (two) times a week.   Yes [provider]  EPIPEN 2-PAK 0.3 MG/0.3ML SOAJ injection USE AS DIRECTED  FOR SEVERE ALLERGIC REACTION 07/11/17  Yes Bobbitt, Heywood Iles, MD  fexofenadine (ALLEGRA) 180 MG tablet Take 1 tablet (180 mg total) by mouth daily. Alternate every few months with Xyzal. 09/23/18  Yes Bobbitt, Heywood Iles, MD  fluticasone [facility]) 50 MCG/ACT nasal spray Place 2 sprays into both nostrils daily. 04/29/18  Yes Bobbitt, Heywood Iles, MD  levocetirizine (XYZAL) 5 MG tablet Take 1 tablet (5 mg total) by mouth daily. Patient taking differently: Take 5 mg by mouth at bedtime. 04/15/18  Yes Bobbitt, Heywood Iles, MD  PRESCRIPTION MEDICATION Take 1 [place] by mouth daily. BPC 157   Yes [provider]  Propylene Glycol (SYSTANE BALANCE) 0.6 % SOLN Place 1 drop into both eyes in the morning.   Yes [provider]  sildenafil (REVATIO) 20 MG tablet Take 20 mg by mouth daily as needed (ED).   Yes [provider]  testosterone cypionate (DEPOTESTOSTERONE CYPIONATE) 200 MG/ML injection Inject 80 mg into the muscle 2 (two) times a week.  2.3 ml IM every other week 03/13/18  Yes [provider]  traZODone (DESYREL) 50 MG tablet Take 50 mg by mouth at bedtime. 09/03/19  Yes [provider]  Albuterol Sulfate (PROAIR RESPICLICK) 108 (90 Base) MCG/ACT AEPB Inhale 2 puffs into the lungs every 4 (four) hours as needed. For cough or wheeze.  May use 2 puffs 10-20 minutes prior to exercise. Patient not taking: No sig reported 07/11/17   Bobbitt, Heywood Iles, MD  amoxicillin-clavulanate (AUGMENTIN) 875-125 MG tablet Take 1 tablet by mouth 2 (two) times daily. Patient not taking: No sig reported 01/19/20   Waldon Merl, PA-C  B-D 3CC LUER-LOK SYR 20GX1" 20G X 1" 3 ML MISC USE TO INJECT ONCE WEEKLY 03/28/18   [provider]  promethazine-dextromethorphan (PROMETHAZINE-DM) 6.25-15 MG/5ML syrup Take 5 mLs by mouth 4 (four) times daily as needed for cough. Patient not taking: No sig reported 01/19/20   Waldon Merl, PA-C     All other systems have been reviewed and were otherwise negative with the exception of those mentioned in the HPI and as above.  Physical Exam: Vitals:   11/30/20 0558  BP: 138/71  Pulse: 88  Resp: 18  Temp: 98.6 F (37 C)  SpO2: 100%    Body mass index is 27.2 kg/m.  General: Alert, no acute distress Cardiovascular: No pedal edema Respiratory: No cyanosis, no use of accessory musculature Skin: No lesions in the area of chief complaint Neurologic: Sensation intact distally Psychiatric: Patient is competent for consent with normal mood and affect Lymphatic: No axillary or cervical lymphadenopathy   Assessment/Plan: LEFT C7 RADICULOPATHY Plan for Procedure(s): ANTERIOR CERVICAL DECOMPRESSION FUSION CERVICAL 6- CERVICAL 7 WITH INSTRUMENTATION AND ALLOGRAFT   Jackelyn Hoehn, MD 11/30/2020 7:15 AM

## 2020-11-30 NOTE — Anesthesia Procedure Notes (Signed)
Procedure Name: Intubation Date/Time: 11/30/2020 7:55 AM Performed by: Lasandra Beech, CRNA Pre-anesthesia Checklist: Patient identified Patient Re-evaluated:Patient Re-evaluated prior to induction Oxygen Delivery Method: Circle system utilized Preoxygenation: Pre-oxygenation with 100% oxygen Induction Type: IV induction Ventilation: Mask ventilation without difficulty Laryngoscope Size: Glidescope and 4 Grade View: Grade I Tube type: Oral Tube size: 7.5 mm Number of attempts: 1 Airway Equipment and Method: Video-laryngoscopy and Rigid stylet Placement Confirmation: ETT inserted through vocal cords under direct vision, positive ETCO2 and breath sounds checked- equal and bilateral Secured at: 23 cm Tube secured with: Tape Dental Injury: Teeth and Oropharynx as per pre-operative assessment

## 2020-12-01 ENCOUNTER — Encounter (HOSPITAL_COMMUNITY): Payer: Self-pay | Admitting: Orthopedic Surgery

## 2020-12-01 NOTE — Anesthesia Postprocedure Evaluation (Signed)
Anesthesia Post Note  Patient: Jeremy [name]  Procedure(s) Performed: ANTERIOR CERVICAL DECOMPRESSION FUSION CERVICAL SIX- CERVICAL SEVEN WITH INSTRUMENTATION AND ALLOGRAFT     Patient location during evaluation: PACU Anesthesia Type: General Level of consciousness: sedated and patient cooperative Pain management: pain level controlled Vital Signs Assessment: post-procedure vital signs reviewed and stable Respiratory status: spontaneous breathing Cardiovascular status: stable Anesthetic complications: no   No notable events documented.  Last Vitals:  Vitals:   11/30/20 1017 11/30/20 1033  BP: (!) 144/80 (!) 142/73  Pulse: 95 92  Resp: 17 17  Temp:  36.4 C  SpO2: 92% 92%    Last Pain:  Vitals:   11/30/20 1033  TempSrc:   PainSc: 0-No pain                 Lewie Loron

## 2020-12-06 DIAGNOSIS — R079 Chest pain, unspecified: Secondary | ICD-10-CM | POA: Diagnosis not present

## 2020-12-06 DIAGNOSIS — R7989 Other specified abnormal findings of blood chemistry: Secondary | ICD-10-CM | POA: Diagnosis not present

## 2020-12-06 DIAGNOSIS — R5381 Other malaise: Secondary | ICD-10-CM | POA: Diagnosis not present

## 2020-12-06 DIAGNOSIS — E291 Testicular hypofunction: Secondary | ICD-10-CM | POA: Diagnosis not present

## 2020-12-06 DIAGNOSIS — R7309 Other abnormal glucose: Secondary | ICD-10-CM | POA: Diagnosis not present

## 2020-12-15 DIAGNOSIS — J309 Allergic rhinitis, unspecified: Secondary | ICD-10-CM | POA: Diagnosis not present

## 2020-12-15 DIAGNOSIS — J069 Acute upper respiratory infection, unspecified: Secondary | ICD-10-CM | POA: Diagnosis not present

## 2020-12-15 DIAGNOSIS — N400 Enlarged prostate without lower urinary tract symptoms: Secondary | ICD-10-CM | POA: Diagnosis not present

## 2020-12-15 DIAGNOSIS — R5383 Other fatigue: Secondary | ICD-10-CM | POA: Diagnosis not present

## 2021-01-10 DIAGNOSIS — Z9889 Other specified postprocedural states: Secondary | ICD-10-CM | POA: Diagnosis not present

## 2021-01-10 DIAGNOSIS — M5412 Radiculopathy, cervical region: Secondary | ICD-10-CM | POA: Diagnosis not present

## 2021-02-24 DIAGNOSIS — M5412 Radiculopathy, cervical region: Secondary | ICD-10-CM | POA: Diagnosis not present

## 2021-02-24 DIAGNOSIS — Z9889 Other specified postprocedural states: Secondary | ICD-10-CM | POA: Diagnosis not present

## 2021-03-03 DIAGNOSIS — R7309 Other abnormal glucose: Secondary | ICD-10-CM | POA: Diagnosis not present

## 2021-03-03 DIAGNOSIS — R079 Chest pain, unspecified: Secondary | ICD-10-CM | POA: Diagnosis not present

## 2021-03-03 DIAGNOSIS — R5381 Other malaise: Secondary | ICD-10-CM | POA: Diagnosis not present

## 2021-03-03 DIAGNOSIS — E291 Testicular hypofunction: Secondary | ICD-10-CM | POA: Diagnosis not present

## 2021-03-16 DIAGNOSIS — R5383 Other fatigue: Secondary | ICD-10-CM | POA: Diagnosis not present

## 2021-03-16 DIAGNOSIS — J309 Allergic rhinitis, unspecified: Secondary | ICD-10-CM | POA: Diagnosis not present

## 2021-03-16 DIAGNOSIS — J069 Acute upper respiratory infection, unspecified: Secondary | ICD-10-CM | POA: Diagnosis not present

## 2021-03-16 DIAGNOSIS — N400 Enlarged prostate without lower urinary tract symptoms: Secondary | ICD-10-CM | POA: Diagnosis not present

## 2021-03-23 DIAGNOSIS — D225 Melanocytic nevi of trunk: Secondary | ICD-10-CM | POA: Diagnosis not present

## 2021-03-23 DIAGNOSIS — L578 Other skin changes due to chronic exposure to nonionizing radiation: Secondary | ICD-10-CM | POA: Diagnosis not present

## 2021-03-23 DIAGNOSIS — L570 Actinic keratosis: Secondary | ICD-10-CM | POA: Diagnosis not present

## 2021-03-23 DIAGNOSIS — L814 Other melanin hyperpigmentation: Secondary | ICD-10-CM | POA: Diagnosis not present

## 2021-03-23 DIAGNOSIS — L821 Other seborrheic keratosis: Secondary | ICD-10-CM | POA: Diagnosis not present

## 2021-04-06 DIAGNOSIS — N400 Enlarged prostate without lower urinary tract symptoms: Secondary | ICD-10-CM | POA: Diagnosis not present

## 2021-04-13 DIAGNOSIS — M25511 Pain in right shoulder: Secondary | ICD-10-CM | POA: Diagnosis not present

## 2021-04-14 DIAGNOSIS — N5201 Erectile dysfunction due to arterial insufficiency: Secondary | ICD-10-CM | POA: Diagnosis not present

## 2021-04-14 DIAGNOSIS — R351 Nocturia: Secondary | ICD-10-CM | POA: Diagnosis not present

## 2021-04-14 DIAGNOSIS — N401 Enlarged prostate with lower urinary tract symptoms: Secondary | ICD-10-CM | POA: Diagnosis not present

## 2021-04-14 DIAGNOSIS — R972 Elevated prostate specific antigen [PSA]: Secondary | ICD-10-CM | POA: Diagnosis not present

## 2021-04-25 DIAGNOSIS — M25511 Pain in right shoulder: Secondary | ICD-10-CM | POA: Diagnosis not present

## 2021-05-08 DIAGNOSIS — M25511 Pain in right shoulder: Secondary | ICD-10-CM | POA: Diagnosis not present

## 2021-05-18 DIAGNOSIS — M25511 Pain in right shoulder: Secondary | ICD-10-CM | POA: Diagnosis not present

## 2021-05-31 DIAGNOSIS — M25511 Pain in right shoulder: Secondary | ICD-10-CM | POA: Diagnosis not present

## 2021-06-02 DIAGNOSIS — R079 Chest pain, unspecified: Secondary | ICD-10-CM | POA: Diagnosis not present

## 2021-06-02 DIAGNOSIS — E559 Vitamin D deficiency, unspecified: Secondary | ICD-10-CM | POA: Diagnosis not present

## 2021-06-02 DIAGNOSIS — N400 Enlarged prostate without lower urinary tract symptoms: Secondary | ICD-10-CM | POA: Diagnosis not present

## 2021-06-02 DIAGNOSIS — E039 Hypothyroidism, unspecified: Secondary | ICD-10-CM | POA: Diagnosis not present

## 2021-06-02 DIAGNOSIS — R7309 Other abnormal glucose: Secondary | ICD-10-CM | POA: Diagnosis not present

## 2021-06-02 DIAGNOSIS — R5381 Other malaise: Secondary | ICD-10-CM | POA: Diagnosis not present

## 2021-06-02 DIAGNOSIS — R972 Elevated prostate specific antigen [PSA]: Secondary | ICD-10-CM | POA: Diagnosis not present

## 2021-06-02 DIAGNOSIS — E291 Testicular hypofunction: Secondary | ICD-10-CM | POA: Diagnosis not present

## 2021-06-07 DIAGNOSIS — M25511 Pain in right shoulder: Secondary | ICD-10-CM | POA: Diagnosis not present

## 2021-06-14 DIAGNOSIS — M25511 Pain in right shoulder: Secondary | ICD-10-CM | POA: Diagnosis not present

## 2021-06-15 DIAGNOSIS — J309 Allergic rhinitis, unspecified: Secondary | ICD-10-CM | POA: Diagnosis not present

## 2021-06-15 DIAGNOSIS — R5383 Other fatigue: Secondary | ICD-10-CM | POA: Diagnosis not present

## 2021-06-28 DIAGNOSIS — M25511 Pain in right shoulder: Secondary | ICD-10-CM | POA: Diagnosis not present

## 2021-07-13 DIAGNOSIS — M25511 Pain in right shoulder: Secondary | ICD-10-CM | POA: Diagnosis not present

## 2021-07-27 DIAGNOSIS — M25511 Pain in right shoulder: Secondary | ICD-10-CM | POA: Diagnosis not present

## 2021-08-07 DIAGNOSIS — M25511 Pain in right shoulder: Secondary | ICD-10-CM | POA: Diagnosis not present

## 2021-09-27 DIAGNOSIS — E291 Testicular hypofunction: Secondary | ICD-10-CM | POA: Diagnosis not present

## 2021-10-12 DIAGNOSIS — M25511 Pain in right shoulder: Secondary | ICD-10-CM | POA: Diagnosis not present

## 2022-01-02 DIAGNOSIS — L729 Follicular cyst of the skin and subcutaneous tissue, unspecified: Secondary | ICD-10-CM | POA: Diagnosis not present

## 2022-01-02 DIAGNOSIS — L988 Other specified disorders of the skin and subcutaneous tissue: Secondary | ICD-10-CM | POA: Diagnosis not present

## 2022-01-02 DIAGNOSIS — D485 Neoplasm of uncertain behavior of skin: Secondary | ICD-10-CM | POA: Diagnosis not present

## 2022-01-02 DIAGNOSIS — L820 Inflamed seborrheic keratosis: Secondary | ICD-10-CM | POA: Diagnosis not present

## 2022-01-02 DIAGNOSIS — E291 Testicular hypofunction: Secondary | ICD-10-CM | POA: Diagnosis not present

## 2022-02-06 ENCOUNTER — Ambulatory Visit: Payer: BC Managed Care – PPO | Attending: Internal Medicine | Admitting: Internal Medicine

## 2022-02-06 ENCOUNTER — Encounter: Payer: Self-pay | Admitting: Internal Medicine

## 2022-02-06 VITALS — BP 108/60 | HR 66 | Ht 70.0 in | Wt 195.0 lb

## 2022-02-06 DIAGNOSIS — R7982 Elevated C-reactive protein (CRP): Secondary | ICD-10-CM | POA: Insufficient documentation

## 2022-02-06 DIAGNOSIS — I517 Cardiomegaly: Secondary | ICD-10-CM | POA: Diagnosis not present

## 2022-02-06 DIAGNOSIS — E782 Mixed hyperlipidemia: Secondary | ICD-10-CM | POA: Insufficient documentation

## 2022-02-06 DIAGNOSIS — R7989 Other specified abnormal findings of blood chemistry: Secondary | ICD-10-CM | POA: Insufficient documentation

## 2022-02-06 NOTE — Patient Instructions (Signed)
Medication Instructions:  Your physician recommends that you continue on your current medications as directed. Please refer to the Current Medication list given to you today.  *If you need a refill on your cardiac medications before your next appointment, please call your pharmacy*   Lab Work: TODAY: LDL Direct, Lipoprotein A  If you have labs (blood work) drawn today and your tests are completely normal, you will receive your results only by: [place] (if you have MyChart) OR A paper copy in the mail If you have any lab test that is abnormal or we need to change your treatment, we will call you to review the results.   Testing/Procedures: Your physician has requested that you have an echocardiogram. Echocardiography is a painless test that uses sound waves to create images of your heart. It provides your doctor with information about the size and shape of your heart and how well your heart's chambers and valves are working. This procedure takes approximately one hour. There are no restrictions for this procedure. Please do NOT wear cologne, perfume, aftershave, or lotions (deodorant is allowed). Please arrive 15 minutes prior to your appointment time.    Follow-Up:As Needed At [facility], you and your health needs are our priority.  As part of our continuing mission to provide you with exceptional heart care, we have created designated Provider Care Teams.  These Care Teams include your primary Cardiologist (physician) and Advanced Practice Providers (APPs -  Physician Assistants and Nurse Practitioners) who all work together to provide you with the care you need, when you need it.   Provider:   Werner Lean, MD

## 2022-02-06 NOTE — Progress Notes (Signed)
Cardiology Office Note:    Date:  02/06/2022   ID:  Jeremy [name], DOB 1960-02-07, MRN [id number]  PCP:  Jeremy Brunette, MD   [facility] Providers Cardiologist:  Jeremy Constant, MD     Referring MD: Jeremy Brunette, MD   CC: elevated hsCRP Consulted for the evaluation of elevated hs-CRP at the behest of Jeremy [name]   History of Present Illness:    Jeremy [name] is a 62 y.o. male with a hx of HsCRP 12. Low T.  Patient notes that he is feeling great.   Notes that in the past he struggled with alcohol use. He then started seeing and integrated health doctor in [place]. Found to have elevated CRP. Found to have elevated cortisol. Started on a lot of supplements. Started seeing a Systems analyst and was taken off of this.  Started taking testoterone replacement. Taking gummies for sleep. Started creatinine for muscle building. On an aggressive cardio and weigh training regimen with no symptoms save for fatigue. CRP was incidentally elevated.   Has had no chest pain, chest pressure, chest tightness, chest stinging .Marland Kitchen   No shortness of breath, DOE .  No PND or orthopnea.  No weight gain, leg swelling , or abdominal swelling.  No syncope or near syncope . Notes  no palpitations or funny heart beats.     Patient reports prior cardiac testing including negative CCTA with zero Calcium in 2018.  Past Medical History:  Diagnosis Date   Allergy    Anxiety    Claustrophobia   Asthma    Depression    GERD (gastroesophageal reflux disease)    Hearing loss    History of chickenpox    Neuromuscular disorder (HCC)    Thyroid disease     Past Surgical History:  Procedure Laterality Date   ANTERIOR CERVICAL DECOMP/DISCECTOMY FUSION N/A 11/30/2020   Procedure: ANTERIOR CERVICAL DECOMPRESSION FUSION CERVICAL SIX- CERVICAL SEVEN WITH INSTRUMENTATION AND ALLOGRAFT;  Surgeon: Jeremy Bamberg, MD;  Location: [facility] OR;  Service: Orthopedics;  Laterality: N/A;   FRACTURE  SURGERY     Lumbar 2 compression   KNEE SURGERY     SHOULDER SURGERY Right    Arthroscopy   SINUS SURGERY WITH INSTATRAK     TONSILLECTOMY      Current Medications: Current Meds  Medication Sig   anastrozole (ARIMIDEX) 1 MG tablet Take 1 mg by mouth 3 (three) times a week.   clomiPHENE (CLOMID) 50 MG tablet Take by mouth 3 (three) times a week.   Creatine POWD 1 Scoop by Does not apply route daily.   NON FORMULARY at bedtime. Human Growth Hormone..3cc  injection mon-Fri   OVER THE COUNTER MEDICATION 60-90 mg at bedtime. Delta 8 Gummies   Propylene Glycol (SYSTANE BALANCE) 0.6 % SOLN Place 1 drop into both eyes in the morning.   sildenafil (REVATIO) 20 MG tablet Take 20 mg by mouth daily as needed (ED).   testosterone cypionate (DEPOTESTOSTERONE CYPIONATE) 200 MG/ML injection Inject into the muscle 2 (two) times a week. 4cc twice per week     Allergies:   Adhesive [tape]   Social History   Socioeconomic History   Marital status: Divorced    Spouse name: Not on file   Number of children: 4   Years of education: Not on file   Highest education level: Not on file  Occupational History   Not on file  Tobacco Use   Smoking status: Never   Smokeless tobacco: Never  Vaping Use   Vaping Use: Never used  Substance and Sexual Activity   Alcohol use: Yes    Comment: Drinks hard liquor daily- 4-8 oz   Drug use: Not Currently    Types: Marijuana    Comment: Marijuana gummies   Sexual activity: Yes  Other Topics Concern   Not on file  Social History Narrative   Not on file   Social Determinants of Health   Financial Resource Strain: Not on file  Food Insecurity: Not on file  Transportation Needs: Not on file  Physical Activity: Not on file  Stress: Not on file  Social Connections: Not on file     Family History: The patient's family history includes Alcohol abuse in his father and paternal grandfather; COPD in his father; Cancer in his mother; Depression in his father  and mother; Diabetes in his maternal grandmother and paternal grandmother; Drug abuse in his father, son, and son; Early death in his father and mother; Heart attack in his father; Heart disease in his father; Hyperlipidemia in his father; Hypertension in his father; Intellectual disability in his father; Miscarriages / Stillbirths in his mother; Stroke in his maternal grandfather.  ROS:   Please see the history of present illness.     All other systems reviewed and are negative.  EKGs/Labs/Other Studies Reviewed:    The following studies were reviewed today:  EKG:  EKG is  ordered today.  The ekg ordered today demonstrates  02/06/22: SR with LVH and secondary repol  Cardiac Studies & Procedures          CT SCANS  CT CORONARY MORPH W/CTA COR W/SCORE 10/10/2016  Narrative CLINICAL DATA:  62 year old male with history of abnormal stress test.  EXAM: CT ANGIOGRAPHY OF THE HEART, CORONARY ARTERY, STRUCTURE, AND MORPHOLOGY  PROCEDURE: CT angiography of the coronary vessels was performed on a 192 channel system using prospective ECG gating. A scout and ECG-gated noncontrast exam (for calcium scoring) were performed. Appropriate delay was determined by bolus tracking after injection of iodinated contrast, and an ECG-gated coronary CTA was performed with sub-mm slice collimation during late diastole. Imaging post processing was performed on an independent workstation creating multiplanar and 3-D images, allowing for quantitative analysis of the heart and coronary arteries. Note that this exam targets the heart and the chest was not imaged in its entirety.  COMPARISON:  Chest CT 04/24/2009.  MEDICATIONS: Lopressor:  10  mg, IV  Nitroglycerin 400 mcg, sublingual  FINDINGS: Technical quality:  Excellent  Heart rate:  55  CORONARY ARTERIES:  Left Main: No stenosis or significant atherosclerotic disease (CAD-RADS 0).  LAD: No stenosis or significant atherosclerotic disease  (CAD-RADS 0).  Diagonals: No stenosis or significant atherosclerotic disease (CAD-RADS 0).  Ramus Intermedius: No stenosis or significant atherosclerotic disease (CAD-RADS 0).  LCx: No stenosis or significant atherosclerotic disease (CAD-RADS 0).  OMs: No stenosis or significant atherosclerotic disease (CAD-RADS 0).  RCA: No stenosis or significant atherosclerotic disease (CAD-RADS 0).  PDA: No stenosis or significant atherosclerotic disease (CAD-RADS 0).  Dominance:  Codominant  CORONARY CALCIUM: Total Agatston Score:  0  MESA database percentile:  N/A  AORTA AND PULMONARY MEASUREMENTS:  Aortic root (21 - 40 mm):  28  mm at the annulus  38  mm at the sinuses of Valsalva  29  mm at the sinotubular junction  Ascending aorta: (< 40 mm):  36 mm  Descending aorta:  (< 40 mm):  27 mm  Main pulmonary artery: (< 30 mm):  26 mm  OTHER FINDINGS: 3 mm pulmonary nodule in the lateral segment of the right middle lobe, unchanged in retrospect compared to prior study 04/24/2009, considered definitively benign. Within the visualized portions of the thorax there are no larger more suspicious appearing pulmonary nodules or masses, there is no acute consolidative airspace disease, no pleural effusion, no pneumothorax and no lymphadenopathy. A few densely calcified subcarinal lymph nodes are incidentally noted. Several well-defined low-attenuation lesions are noted in the liver, largest of which measure up to 2.5 cm in segment 2, compatible with simple cysts. Several smaller liver lesions are too small to definitively characterize, but appear similar to remote prior study 2011, presumably small cysts. Multiple calcified granulomas in the spleen. There are no aggressive appearing lytic or blastic lesions noted in the visualized portions of the skeleton.  IMPRESSION: 1. No evidence of significant coronary artery atherosclerosis. Patient's total coronary artery calcium score is  0 which indicates a very low (but non-zero) risk of major adverse cardiovascular events over the next 10 years. 2. No significant incidental noncardiac findings are noted.  Report was called to CDU mid level at [phone] on time/date.   Electronically Signed By: Vinnie Langton M.D. On: 10/10/2016 11:43           Recent Labs: No results found for requested labs within last 365 days.  Recent Lipid Panel No results found for: "CHOL", "TRIG", "HDL", "CHOLHDL", "VLDL", "LDLCALC", "LDLDIRECT"       Physical Exam:    VS:  BP 108/60   Pulse 66   Ht 5\' 10"  (1.778 m)   Wt 195 lb (88.5 kg)   SpO2 97%   BMI 27.98 kg/m     Wt Readings from Last 3 Encounters:  02/06/22 195 lb (88.5 kg)  11/30/20 195 lb (88.5 kg)  11/22/20 199 lb (90.3 kg)    GEN:  Well nourished, well developed in no acute distress HEENT: Bilateral Frank's Sign NECK: No JVD CARDIAC: RRR, no murmurs, rubs, gallops RESPIRATORY:  Clear to auscultation without rales, wheezing or rhonchi  ABDOMEN: Soft, non-tender, non-distended MUSCULOSKELETAL:  No edema; No deformity  SKIN: Warm and dry NEUROLOGIC:  Alert and oriented x 3 PSYCHIATRIC:  Normal affect   ASSESSMENT:    1. LVH (left ventricular hypertrophy)   2. Elevated C-reactive protein (CRP)   3. Mixed hyperlipidemia   4. Low testosterone    PLAN:    Elevated Hs-CRP Prior HLD - will get LDL direct and Lp(a) - we have reviewed his past data going back form several years from his integrative medicine  - we have reviewed the data behind [place] - based on labs will offer statin and send him pros and cons - unless FH will likely not suggestive colchicine at this time  LVH - suspect athlete's heart; getting echo with strain to rule out HCM   Low T and cardiovascular risk - based on the TRANSVERSE Trial from NEJM 2023 - Testosterone therapy in middle-aged and older men with hypogonadism compared to placebo does not increase major cardiovascular  events. - The incidence of pulmonary embolism, nonfatal arrhythmia, atrial fibrillation, and acute injury was higher in subjects who received testosterone replacement therapy. - if this is pursued, we would set an LDL goal of < 70 and BP goal < 130/80  Time Spent Directly with Patient:   I have spent a total of 60 minutes with the patient reviewing notes, imaging, EKGs, labs and examining the patient as well as establishing an assessment and  plan that was discussed personally with the patient.  > 50% of time was spent in direct patient care.   PRN        Medication Adjustments/Labs and Tests Ordered: Current medicines are reviewed at length with the patient today.  Concerns regarding medicines are outlined above.  Orders Placed This Encounter  Procedures   LDL cholesterol, direct   Lipoprotein A (LPA)   EKG 12-Lead   ECHOCARDIOGRAM COMPLETE   No orders of the defined types were placed in this encounter.   Patient Instructions  Medication Instructions:  Your physician recommends that you continue on your current medications as directed. Please refer to the Current Medication list given to you today.  *If you need a refill on your cardiac medications before your next appointment, please call your pharmacy*   Lab Work: TODAY: LDL Direct, Lipoprotein A  If you have labs (blood work) drawn today and your tests are completely normal, you will receive your results only by: [place] (if you have MyChart) OR A paper copy in the mail If you have any lab test that is abnormal or we need to change your treatment, we will call you to review the results.   Testing/Procedures: Your physician has requested that you have an echocardiogram. Echocardiography is a painless test that uses sound waves to create images of your heart. It provides your doctor with information about the size and shape of your heart and how well your heart's chambers and valves are working. This procedure  takes approximately one hour. There are no restrictions for this procedure. Please do NOT wear cologne, perfume, aftershave, or lotions (deodorant is allowed). Please arrive 15 minutes prior to your appointment time.    Follow-Up:As Needed At [facility], you and your health needs are our priority.  As part of our continuing mission to provide you with exceptional heart care, we have created designated Provider Care Teams.  These Care Teams include your primary Cardiologist (physician) and Advanced Practice Providers (APPs -  Physician Assistants and Nurse Practitioners) who all work together to provide you with the care you need, when you need it.   Provider:   Werner Lean, MD           Signed, Werner Lean, MD  02/06/2022 11:21 AM    [place]

## 2022-02-07 LAB — LDL CHOLESTEROL, DIRECT: LDL Direct: 98 mg/dL (ref 0–99)

## 2022-02-07 LAB — LIPOPROTEIN A (LPA): Lipoprotein (a): 21 nmol/L (ref <75.0)

## 2022-02-08 ENCOUNTER — Ambulatory Visit (HOSPITAL_COMMUNITY): Payer: BC Managed Care – PPO | Attending: Cardiovascular Disease

## 2022-02-08 DIAGNOSIS — I517 Cardiomegaly: Secondary | ICD-10-CM | POA: Diagnosis not present

## 2022-02-08 LAB — ECHOCARDIOGRAM COMPLETE
Area-P 1/2: 3.33 cm2
S' Lateral: 3.6 cm

## 2022-03-27 DIAGNOSIS — L821 Other seborrheic keratosis: Secondary | ICD-10-CM | POA: Diagnosis not present

## 2022-03-27 DIAGNOSIS — L578 Other skin changes due to chronic exposure to nonionizing radiation: Secondary | ICD-10-CM | POA: Diagnosis not present

## 2022-03-27 DIAGNOSIS — D225 Melanocytic nevi of trunk: Secondary | ICD-10-CM | POA: Diagnosis not present

## 2022-03-27 DIAGNOSIS — L814 Other melanin hyperpigmentation: Secondary | ICD-10-CM | POA: Diagnosis not present

## 2022-03-27 DIAGNOSIS — L570 Actinic keratosis: Secondary | ICD-10-CM | POA: Diagnosis not present

## 2022-04-12 DIAGNOSIS — Z1331 Encounter for screening for depression: Secondary | ICD-10-CM | POA: Diagnosis not present

## 2022-04-12 DIAGNOSIS — F1010 Alcohol abuse, uncomplicated: Secondary | ICD-10-CM | POA: Diagnosis not present

## 2022-04-12 DIAGNOSIS — Z1339 Encounter for screening examination for other mental health and behavioral disorders: Secondary | ICD-10-CM | POA: Diagnosis not present

## 2022-04-12 DIAGNOSIS — R7982 Elevated C-reactive protein (CRP): Secondary | ICD-10-CM | POA: Diagnosis not present

## 2022-04-12 DIAGNOSIS — R7301 Impaired fasting glucose: Secondary | ICD-10-CM | POA: Diagnosis not present

## 2022-04-12 DIAGNOSIS — Z113 Encounter for screening for infections with a predominantly sexual mode of transmission: Secondary | ICD-10-CM | POA: Diagnosis not present

## 2022-05-14 DIAGNOSIS — N401 Enlarged prostate with lower urinary tract symptoms: Secondary | ICD-10-CM | POA: Diagnosis not present

## 2022-05-14 DIAGNOSIS — R351 Nocturia: Secondary | ICD-10-CM | POA: Diagnosis not present

## 2022-05-21 DIAGNOSIS — N5201 Erectile dysfunction due to arterial insufficiency: Secondary | ICD-10-CM | POA: Diagnosis not present

## 2022-05-21 DIAGNOSIS — R351 Nocturia: Secondary | ICD-10-CM | POA: Diagnosis not present

## 2022-05-21 DIAGNOSIS — R972 Elevated prostate specific antigen [PSA]: Secondary | ICD-10-CM | POA: Diagnosis not present

## 2022-05-21 DIAGNOSIS — N401 Enlarged prostate with lower urinary tract symptoms: Secondary | ICD-10-CM | POA: Diagnosis not present

## 2022-06-07 DIAGNOSIS — H40013 Open angle with borderline findings, low risk, bilateral: Secondary | ICD-10-CM | POA: Diagnosis not present

## 2022-06-22 DIAGNOSIS — M25561 Pain in right knee: Secondary | ICD-10-CM | POA: Diagnosis not present

## 2022-06-25 DIAGNOSIS — M25562 Pain in left knee: Secondary | ICD-10-CM | POA: Diagnosis not present

## 2022-06-25 DIAGNOSIS — M25561 Pain in right knee: Secondary | ICD-10-CM | POA: Diagnosis not present

## 2022-07-13 ENCOUNTER — Other Ambulatory Visit: Payer: Self-pay | Admitting: Orthopedic Surgery

## 2022-07-13 DIAGNOSIS — M542 Cervicalgia: Secondary | ICD-10-CM

## 2022-07-13 DIAGNOSIS — M5412 Radiculopathy, cervical region: Secondary | ICD-10-CM | POA: Diagnosis not present

## 2022-07-22 ENCOUNTER — Ambulatory Visit
Admission: RE | Admit: 2022-07-22 | Discharge: 2022-07-22 | Disposition: A | Discharge status: Home / Self Care | Payer: BC Managed Care – PPO | Source: Ambulatory Visit | Attending: Orthopedic Surgery | Admitting: Orthopedic Surgery

## 2022-07-22 DIAGNOSIS — Z981 Arthrodesis status: Secondary | ICD-10-CM | POA: Diagnosis not present

## 2022-07-22 DIAGNOSIS — M542 Cervicalgia: Secondary | ICD-10-CM

## 2022-07-27 DIAGNOSIS — M5412 Radiculopathy, cervical region: Secondary | ICD-10-CM | POA: Diagnosis not present

## 2022-07-31 DIAGNOSIS — M5412 Radiculopathy, cervical region: Secondary | ICD-10-CM | POA: Diagnosis not present

## 2022-07-31 DIAGNOSIS — M542 Cervicalgia: Secondary | ICD-10-CM | POA: Diagnosis not present

## 2022-08-04 ENCOUNTER — Other Ambulatory Visit: Payer: BC Managed Care – PPO

## 2022-08-14 DIAGNOSIS — M5412 Radiculopathy, cervical region: Secondary | ICD-10-CM | POA: Diagnosis not present

## 2022-08-28 DIAGNOSIS — M5412 Radiculopathy, cervical region: Secondary | ICD-10-CM | POA: Diagnosis not present

## 2022-08-29 DIAGNOSIS — M542 Cervicalgia: Secondary | ICD-10-CM | POA: Diagnosis not present

## 2022-09-07 DIAGNOSIS — M1711 Unilateral primary osteoarthritis, right knee: Secondary | ICD-10-CM | POA: Diagnosis not present

## 2022-09-07 DIAGNOSIS — M542 Cervicalgia: Secondary | ICD-10-CM | POA: Diagnosis not present

## 2022-09-14 DIAGNOSIS — M1711 Unilateral primary osteoarthritis, right knee: Secondary | ICD-10-CM | POA: Diagnosis not present

## 2022-09-20 DIAGNOSIS — M542 Cervicalgia: Secondary | ICD-10-CM | POA: Diagnosis not present

## 2022-09-24 DIAGNOSIS — M1711 Unilateral primary osteoarthritis, right knee: Secondary | ICD-10-CM | POA: Diagnosis not present

## 2022-10-09 DIAGNOSIS — M542 Cervicalgia: Secondary | ICD-10-CM | POA: Diagnosis not present

## 2022-10-12 DIAGNOSIS — E291 Testicular hypofunction: Secondary | ICD-10-CM | POA: Diagnosis not present

## 2022-10-22 DIAGNOSIS — M1711 Unilateral primary osteoarthritis, right knee: Secondary | ICD-10-CM | POA: Diagnosis not present

## 2022-10-25 DIAGNOSIS — M542 Cervicalgia: Secondary | ICD-10-CM | POA: Diagnosis not present

## 2022-11-15 DIAGNOSIS — M542 Cervicalgia: Secondary | ICD-10-CM | POA: Diagnosis not present

## 2023-01-23 DIAGNOSIS — R051 Acute cough: Secondary | ICD-10-CM | POA: Diagnosis not present

## 2023-01-23 DIAGNOSIS — H6121 Impacted cerumen, right ear: Secondary | ICD-10-CM | POA: Diagnosis not present

## 2023-01-23 DIAGNOSIS — H6691 Otitis media, unspecified, right ear: Secondary | ICD-10-CM | POA: Diagnosis not present

## 2023-01-23 DIAGNOSIS — J3489 Other specified disorders of nose and nasal sinuses: Secondary | ICD-10-CM | POA: Diagnosis not present

## 2023-04-02 DIAGNOSIS — L578 Other skin changes due to chronic exposure to nonionizing radiation: Secondary | ICD-10-CM | POA: Diagnosis not present

## 2023-04-02 DIAGNOSIS — D485 Neoplasm of uncertain behavior of skin: Secondary | ICD-10-CM | POA: Diagnosis not present

## 2023-04-02 DIAGNOSIS — L988 Other specified disorders of the skin and subcutaneous tissue: Secondary | ICD-10-CM | POA: Diagnosis not present

## 2023-04-02 DIAGNOSIS — L814 Other melanin hyperpigmentation: Secondary | ICD-10-CM | POA: Diagnosis not present

## 2023-04-02 DIAGNOSIS — L570 Actinic keratosis: Secondary | ICD-10-CM | POA: Diagnosis not present

## 2023-04-02 DIAGNOSIS — L821 Other seborrheic keratosis: Secondary | ICD-10-CM | POA: Diagnosis not present

## 2023-04-02 DIAGNOSIS — D225 Melanocytic nevi of trunk: Secondary | ICD-10-CM | POA: Diagnosis not present

## 2023-04-08 DIAGNOSIS — R7301 Impaired fasting glucose: Secondary | ICD-10-CM | POA: Diagnosis not present

## 2023-04-08 DIAGNOSIS — Z0000 Encounter for general adult medical examination without abnormal findings: Secondary | ICD-10-CM | POA: Diagnosis not present

## 2023-04-08 DIAGNOSIS — Z113 Encounter for screening for infections with a predominantly sexual mode of transmission: Secondary | ICD-10-CM | POA: Diagnosis not present

## 2023-04-08 DIAGNOSIS — R7989 Other specified abnormal findings of blood chemistry: Secondary | ICD-10-CM | POA: Diagnosis not present

## 2023-04-23 DIAGNOSIS — Z113 Encounter for screening for infections with a predominantly sexual mode of transmission: Secondary | ICD-10-CM | POA: Diagnosis not present

## 2023-04-23 DIAGNOSIS — R7982 Elevated C-reactive protein (CRP): Secondary | ICD-10-CM | POA: Diagnosis not present

## 2023-04-23 DIAGNOSIS — Z23 Encounter for immunization: Secondary | ICD-10-CM | POA: Diagnosis not present

## 2023-04-23 DIAGNOSIS — Z1339 Encounter for screening examination for other mental health and behavioral disorders: Secondary | ICD-10-CM | POA: Diagnosis not present

## 2023-04-23 DIAGNOSIS — Z1331 Encounter for screening for depression: Secondary | ICD-10-CM | POA: Diagnosis not present

## 2023-04-23 DIAGNOSIS — Z0000 Encounter for general adult medical examination without abnormal findings: Secondary | ICD-10-CM | POA: Diagnosis not present

## 2023-04-30 ENCOUNTER — Other Ambulatory Visit: Payer: Self-pay | Admitting: Internal Medicine

## 2023-04-30 DIAGNOSIS — Z0000 Encounter for general adult medical examination without abnormal findings: Secondary | ICD-10-CM

## 2023-05-03 DIAGNOSIS — E291 Testicular hypofunction: Secondary | ICD-10-CM | POA: Diagnosis not present

## 2023-05-13 ENCOUNTER — Other Ambulatory Visit

## 2023-05-14 ENCOUNTER — Ambulatory Visit
Admission: RE | Admit: 2023-05-14 | Discharge: 2023-05-14 | Discharge status: Home / Self Care | Source: Ambulatory Visit | Attending: Internal Medicine

## 2023-05-14 DIAGNOSIS — Z0000 Encounter for general adult medical examination without abnormal findings: Secondary | ICD-10-CM

## 2023-06-04 ENCOUNTER — Other Ambulatory Visit: Payer: Self-pay | Admitting: Internal Medicine

## 2023-06-04 DIAGNOSIS — K7689 Other specified diseases of liver: Secondary | ICD-10-CM

## 2023-06-05 ENCOUNTER — Ambulatory Visit
Admission: RE | Admit: 2023-06-05 | Discharge: 2023-06-05 | Disposition: A | Discharge status: Home / Self Care | Payer: Self-pay | Source: Ambulatory Visit | Attending: Internal Medicine | Admitting: Internal Medicine

## 2023-06-05 DIAGNOSIS — K7689 Other specified diseases of liver: Secondary | ICD-10-CM

## 2023-06-05 DIAGNOSIS — K760 Fatty (change of) liver, not elsewhere classified: Secondary | ICD-10-CM | POA: Diagnosis not present

## 2023-06-07 DIAGNOSIS — H40013 Open angle with borderline findings, low risk, bilateral: Secondary | ICD-10-CM | POA: Diagnosis not present

## 2023-06-12 DIAGNOSIS — D485 Neoplasm of uncertain behavior of skin: Secondary | ICD-10-CM | POA: Diagnosis not present

## 2023-06-12 DIAGNOSIS — D487 Neoplasm of uncertain behavior of other specified sites: Secondary | ICD-10-CM | POA: Diagnosis not present

## 2023-06-26 DIAGNOSIS — D485 Neoplasm of uncertain behavior of skin: Secondary | ICD-10-CM | POA: Diagnosis not present

## 2023-06-26 DIAGNOSIS — N401 Enlarged prostate with lower urinary tract symptoms: Secondary | ICD-10-CM | POA: Diagnosis not present

## 2023-06-26 DIAGNOSIS — D487 Neoplasm of uncertain behavior of other specified sites: Secondary | ICD-10-CM | POA: Diagnosis not present

## 2023-06-26 DIAGNOSIS — D225 Melanocytic nevi of trunk: Secondary | ICD-10-CM | POA: Diagnosis not present

## 2023-06-26 DIAGNOSIS — R351 Nocturia: Secondary | ICD-10-CM | POA: Diagnosis not present

## 2023-06-26 DIAGNOSIS — L905 Scar conditions and fibrosis of skin: Secondary | ICD-10-CM | POA: Diagnosis not present

## 2023-07-03 DIAGNOSIS — R351 Nocturia: Secondary | ICD-10-CM | POA: Diagnosis not present

## 2023-07-03 DIAGNOSIS — N401 Enlarged prostate with lower urinary tract symptoms: Secondary | ICD-10-CM | POA: Diagnosis not present

## 2023-07-03 DIAGNOSIS — R972 Elevated prostate specific antigen [PSA]: Secondary | ICD-10-CM | POA: Diagnosis not present

## 2023-07-03 DIAGNOSIS — N5201 Erectile dysfunction due to arterial insufficiency: Secondary | ICD-10-CM | POA: Diagnosis not present

## 2023-09-06 DIAGNOSIS — K760 Fatty (change of) liver, not elsewhere classified: Secondary | ICD-10-CM | POA: Diagnosis not present

## 2023-10-11 DIAGNOSIS — D225 Melanocytic nevi of trunk: Secondary | ICD-10-CM | POA: Diagnosis not present

## 2023-10-28 DIAGNOSIS — M542 Cervicalgia: Secondary | ICD-10-CM | POA: Diagnosis not present

## 2023-10-29 DIAGNOSIS — M47812 Spondylosis without myelopathy or radiculopathy, cervical region: Secondary | ICD-10-CM | POA: Diagnosis not present

## 2023-11-25 DIAGNOSIS — K760 Fatty (change of) liver, not elsewhere classified: Secondary | ICD-10-CM | POA: Diagnosis not present
